# Patient Record
Sex: Male | Born: 1935 | Race: White | Hispanic: No | Marital: Married | Smoking: Former smoker
Health system: Southern US, Community
[De-identification: ages and names within clinical notes are randomized; demographics above are authoritative.]

## PROBLEM LIST (undated history)

## (undated) DIAGNOSIS — M10071 Idiopathic gout, right ankle and foot: Secondary | ICD-10-CM

## (undated) DIAGNOSIS — I7300 Raynaud's syndrome without gangrene: Secondary | ICD-10-CM

## (undated) DIAGNOSIS — C4490 Unspecified malignant neoplasm of skin, unspecified: Secondary | ICD-10-CM

## (undated) DIAGNOSIS — K5900 Constipation, unspecified: Secondary | ICD-10-CM

## (undated) DIAGNOSIS — K635 Polyp of colon: Secondary | ICD-10-CM

## (undated) DIAGNOSIS — R739 Hyperglycemia, unspecified: Secondary | ICD-10-CM

## (undated) DIAGNOSIS — R1907 Generalized intra-abdominal and pelvic swelling, mass and lump: Secondary | ICD-10-CM

## (undated) DIAGNOSIS — I4892 Unspecified atrial flutter: Secondary | ICD-10-CM

## (undated) DIAGNOSIS — N529 Male erectile dysfunction, unspecified: Secondary | ICD-10-CM

## (undated) DIAGNOSIS — M1990 Unspecified osteoarthritis, unspecified site: Secondary | ICD-10-CM

## (undated) HISTORY — PX: CATARACT EXTRACTION: SUR2

## (undated) HISTORY — PX: OTHER SURGICAL HISTORY: SHX169

## (undated) HISTORY — PX: RETINAL DETACHMENT SURGERY: SHX105

## (undated) HISTORY — PX: APPENDECTOMY: SHX54

## (undated) HISTORY — PX: TONSILLECTOMY: SUR1361

---

## 2014-06-30 DIAGNOSIS — E7800 Pure hypercholesterolemia, unspecified: Secondary | ICD-10-CM | POA: Insufficient documentation

## 2014-10-07 ENCOUNTER — Ambulatory Visit: Payer: Self-pay | Admitting: Internal Medicine

## 2015-03-11 ENCOUNTER — Other Ambulatory Visit: Payer: Self-pay | Admitting: Urology

## 2015-03-11 DIAGNOSIS — N138 Other obstructive and reflux uropathy: Secondary | ICD-10-CM

## 2015-03-11 DIAGNOSIS — N401 Enlarged prostate with lower urinary tract symptoms: Principal | ICD-10-CM

## 2015-03-11 MED ORDER — TAMSULOSIN HCL 0.4 MG PO CAPS: 0.4000 mg | ORAL_CAPSULE | Freq: Once | ORAL | Status: DC

## 2015-05-07 ENCOUNTER — Other Ambulatory Visit: Payer: Self-pay | Admitting: Urology

## 2015-05-12 DIAGNOSIS — R1907 Generalized intra-abdominal and pelvic swelling, mass and lump: Secondary | ICD-10-CM | POA: Insufficient documentation

## 2015-05-12 DIAGNOSIS — Z86010 Personal history of colonic polyps: Secondary | ICD-10-CM | POA: Insufficient documentation

## 2015-05-21 ENCOUNTER — Encounter: Payer: Self-pay | Admitting: *Deleted

## 2015-05-22 ENCOUNTER — Encounter
Admission: RE | Disposition: A | Discharge status: Home / Self Care | Payer: Self-pay | Source: Ambulatory Visit | Attending: Unknown Physician Specialty

## 2015-05-22 ENCOUNTER — Ambulatory Visit
Admission: RE | Admit: 2015-05-22 | Discharge: 2015-05-22 | Disposition: A | Discharge status: Home / Self Care | Payer: Medicare Other | Source: Ambulatory Visit | Attending: Unknown Physician Specialty | Admitting: Unknown Physician Specialty

## 2015-05-22 ENCOUNTER — Ambulatory Visit: Payer: Medicare Other | Admitting: Anesthesiology

## 2015-05-22 DIAGNOSIS — M10071 Idiopathic gout, right ankle and foot: Secondary | ICD-10-CM | POA: Insufficient documentation

## 2015-05-22 DIAGNOSIS — K640 First degree hemorrhoids: Secondary | ICD-10-CM | POA: Insufficient documentation

## 2015-05-22 DIAGNOSIS — I10 Essential (primary) hypertension: Secondary | ICD-10-CM | POA: Insufficient documentation

## 2015-05-22 DIAGNOSIS — E039 Hypothyroidism, unspecified: Secondary | ICD-10-CM | POA: Diagnosis not present

## 2015-05-22 DIAGNOSIS — Z86010 Personal history of colonic polyps: Secondary | ICD-10-CM | POA: Insufficient documentation

## 2015-05-22 DIAGNOSIS — K5730 Diverticulosis of large intestine without perforation or abscess without bleeding: Secondary | ICD-10-CM | POA: Insufficient documentation

## 2015-05-22 DIAGNOSIS — M1990 Unspecified osteoarthritis, unspecified site: Secondary | ICD-10-CM | POA: Insufficient documentation

## 2015-05-22 DIAGNOSIS — Z79899 Other long term (current) drug therapy: Secondary | ICD-10-CM | POA: Insufficient documentation

## 2015-05-22 DIAGNOSIS — D123 Benign neoplasm of transverse colon: Secondary | ICD-10-CM | POA: Insufficient documentation

## 2015-05-22 DIAGNOSIS — D127 Benign neoplasm of rectosigmoid junction: Secondary | ICD-10-CM | POA: Diagnosis not present

## 2015-05-22 DIAGNOSIS — Z885 Allergy status to narcotic agent status: Secondary | ICD-10-CM | POA: Insufficient documentation

## 2015-05-22 DIAGNOSIS — I4892 Unspecified atrial flutter: Secondary | ICD-10-CM | POA: Diagnosis not present

## 2015-05-22 DIAGNOSIS — N529 Male erectile dysfunction, unspecified: Secondary | ICD-10-CM | POA: Diagnosis not present

## 2015-05-22 DIAGNOSIS — Z886 Allergy status to analgesic agent status: Secondary | ICD-10-CM | POA: Insufficient documentation

## 2015-05-22 DIAGNOSIS — Z09 Encounter for follow-up examination after completed treatment for conditions other than malignant neoplasm: Secondary | ICD-10-CM | POA: Insufficient documentation

## 2015-05-22 DIAGNOSIS — Z85828 Personal history of other malignant neoplasm of skin: Secondary | ICD-10-CM | POA: Diagnosis not present

## 2015-05-22 DIAGNOSIS — I7300 Raynaud's syndrome without gangrene: Secondary | ICD-10-CM | POA: Insufficient documentation

## 2015-05-22 DIAGNOSIS — K635 Polyp of colon: Secondary | ICD-10-CM | POA: Insufficient documentation

## 2015-05-22 HISTORY — DX: Idiopathic gout, right ankle and foot: M10.071

## 2015-05-22 HISTORY — DX: Generalized intra-abdominal and pelvic swelling, mass and lump: R19.07

## 2015-05-22 HISTORY — DX: Unspecified atrial flutter: I48.92

## 2015-05-22 HISTORY — DX: Unspecified malignant neoplasm of skin, unspecified: C44.90

## 2015-05-22 HISTORY — DX: Hyperglycemia, unspecified: R73.9

## 2015-05-22 HISTORY — DX: Polyp of colon: K63.5

## 2015-05-22 HISTORY — PX: COLONOSCOPY WITH PROPOFOL: SHX5780

## 2015-05-22 HISTORY — DX: Male erectile dysfunction, unspecified: N52.9

## 2015-05-22 HISTORY — DX: Constipation, unspecified: K59.00

## 2015-05-22 HISTORY — DX: Unspecified osteoarthritis, unspecified site: M19.90

## 2015-05-22 HISTORY — DX: Raynaud's syndrome without gangrene: I73.00

## 2015-05-22 SURGERY — COLONOSCOPY WITH PROPOFOL
Anesthesia: General

## 2015-05-22 MED ORDER — SODIUM CHLORIDE 0.9 % IV SOLN: INTRAVENOUS | Status: DC

## 2015-05-22 MED ORDER — SOTALOL HCL 120 MG PO TABS: 120.0000 mg | ORAL_TABLET | Freq: Once | ORAL | Status: DC

## 2015-05-22 MED ORDER — LIDOCAINE HCL (PF) 1 % IJ SOLN
INTRAMUSCULAR | Status: AC
  Administered 2015-05-22: 0.3 mL
  Filled 2015-05-22: qty 2

## 2015-05-22 MED ORDER — LIDOCAINE HCL (PF) 1 % IJ SOLN: 2.0000 mL | Freq: Once | INTRAMUSCULAR | Status: DC

## 2015-05-22 MED ORDER — SOTALOL HCL 80 MG PO TABS
120.0000 mg | ORAL_TABLET | ORAL | Status: DC
  Filled 2015-05-22: qty 1.5

## 2015-05-22 MED ORDER — SOTALOL HCL 120 MG PO TABS
120.0000 mg | ORAL_TABLET | ORAL | Status: DC
  Administered 2015-05-22: 120 mg via ORAL
  Filled 2015-05-22: qty 1

## 2015-05-22 MED ORDER — PROPOFOL 10 MG/ML IV BOLUS
INTRAVENOUS | Status: DC | PRN
  Administered 2015-05-22: 80 mg via INTRAVENOUS

## 2015-05-22 MED ORDER — SODIUM CHLORIDE 0.9 % IV SOLN
INTRAVENOUS | Status: DC
  Administered 2015-05-22: 1000 mL via INTRAVENOUS

## 2015-05-22 MED ORDER — LACTATED RINGERS IV SOLN
INTRAVENOUS | Status: DC | PRN
  Administered 2015-05-22: 13:00:00 via INTRAVENOUS

## 2015-05-22 MED ORDER — PROPOFOL INFUSION 10 MG/ML OPTIME
INTRAVENOUS | Status: DC | PRN
  Administered 2015-05-22: 125 ug/kg/min via INTRAVENOUS

## 2015-05-22 NOTE — Op Note (Signed)
[facility] Patient Name: Kerry Rowland Procedure Date: 05/22/2015 1:11 PM MRN: [id number] Account #: [id number] Date of Birth: [birth year] Admit Type: Outpatient Age: 79 Room: [facility] ENDO ROOM 1 Gender: Male Note Status: Finalized Procedure:         Colonoscopy Indications:       Personal history of colonic polyps Providers:         Manya Silvas, MD Referring MD:      Ramonita Lab, MD (Referring MD) Medicines:         Propofol per Anesthesia Complications:     No immediate complications. Procedure:         Pre-Anesthesia Assessment:                    - After reviewing the risks and benefits, the patient was                     deemed in satisfactory condition to undergo the procedure.                    After obtaining informed consent, the colonoscope was                     passed under direct vision. Throughout the procedure, the                     patient's blood pressure, pulse, and oxygen saturations                     were monitored continuously. The Olympus PCF-H180AL                     colonoscope ( S#: [id number] ) was introduced through the                     anus and advanced to the the cecum, identified by                     appendiceal orifice and ileocecal valve. The colonoscopy                     was performed without difficulty. The patient tolerated                     the procedure well. The quality of the bowel preparation                     was excellent. Findings:      Three sessile polyps were found in the recto-sigmoid colon and in the       transverse colon. The polyps were diminutive in size. These polyps were       removed with a jumbo cold forceps. Resection and retrieval were complete.      A few small-mouthed diverticula were found in the sigmoid colon.      Internal hemorrhoids were found during endoscopy. The hemorrhoids were       small and Grade I (internal hemorrhoids that do not prolapse). Impression:         - Three diminutive polyps at the recto-sigmoid colon and                     in the transverse colon. Resected and retrieved.                    -  Diverticulosis in the sigmoid colon.                    - Internal hemorrhoids. Recommendation:    - Await pathology results. Manya Silvas, MD 05/22/2015 1:56:38 PM This report has been signed electronically. Number of Addenda: 0 Note Initiated On: 05/22/2015 1:11 PM Scope Withdrawal Time: 0 hours 16 minutes 49 seconds  Total Procedure Duration: 0 hours 26 minutes 4 seconds       [facility]

## 2015-05-22 NOTE — H&P (Signed)
   Primary Care Physician:  Adin Hector, MD Primary Gastroenterologist:  Dr. Vira Agar  Pre-Procedure History & Physical: HPI:  Kerry Rowland is a 79 y.o. male is here for an colonoscopy.   Past Medical History  Diagnosis Date  . Constipation   . Colon polyps   . Generalized abdominal mass   . Acute idiopathic gout of right foot   . Paroxysmal atrial flutter   . Erectile dysfunction   . Osteoarthritis   . Skin cancer   . Raynaud's phenomenon   . Hyperglycemia     No past surgical history on file.  Prior to Admission medications   Medication Sig Start Date End Date Taking? Authorizing Provider  cetirizine (ZYRTEC) 10 MG chewable tablet Chew 10 mg by mouth daily.   Yes Historical Provider, MD  cyclobenzaprine (FLEXERIL) 10 MG tablet Take 10 mg by mouth 3 (three) times daily as needed for muscle spasms.   Yes Historical Provider, MD  finasteride (PROSCAR) 5 MG tablet TAKE 1 TABLET DAILY 05/07/15   Hollice Espy, MD  iodine-sodium iodide 2-2.4 % solution Apply topically once.   Yes Historical Provider, MD  sotalol (BETAPACE) 120 MG tablet Take 120 mg by mouth 2 (two) times daily.   Yes Historical Provider, MD  tamsulosin (FLOMAX) 0.4 MG CAPS capsule Take 1 capsule (0.4 mg total) by mouth once. 03/11/15   Nori Riis, PA-C  tamsulosin (FLOMAX) 0.4 MG CAPS capsule Take 0.4 mg by mouth.   Yes Historical Provider, MD    Allergies as of 05/12/2015  . (Not on File)    No family history on file.  Social History   Social History  . Marital Status: Married    Spouse Name: N/A  . Number of Children: N/A  . Years of Education: N/A   Occupational History  . Not on file.   Social History Main Topics  . Smoking status: Not on file  . Smokeless tobacco: Not on file  . Alcohol Use: Not on file  . Drug Use: Not on file  . Sexual Activity: Not on file   Other Topics Concern  . Not on file   Social History Narrative  . No narrative on file    Review of Systems: See  HPI, otherwise negative ROS  Physical Exam: BP 157/62 mmHg  Pulse 63  Temp(Src) 96.1 F (35.6 C) (Tympanic)  Resp 17  Ht 6\' 2"  (1.88 m)  Wt 77.111 kg (170 lb)  BMI 21.82 kg/m2  SpO2 100% General:   Alert,  pleasant and cooperative in NAD Head:  Normocephalic and atraumatic. Neck:  Supple; no masses or thyromegaly. Lungs:  Clear throughout to auscultation.    Heart:  Regular rate and rhythm. Abdomen:  Soft, nontender and nondistended. Normal bowel sounds, without guarding, and without rebound.   Neurologic:  Alert and  oriented x4;  grossly normal neurologically.  Impression/Plan: SABASTIEN Rowland is here for an colonoscopy to be performed for [facility] colon polyps  Risks, benefits, limitations, and alternatives regarding  colonoscopy have been reviewed with the patient.  Questions have been answered.  All parties agreeable.   Gaylyn Cheers, MD  05/22/2015, 1:04 PM

## 2015-05-22 NOTE — Anesthesia Postprocedure Evaluation (Signed)
  Anesthesia Post-op Note  Patient: Kerry Rowland  Procedure(s) Performed: Procedure(s): COLONOSCOPY WITH PROPOFOL (N/A)  Anesthesia type:General  Patient location: PACU  Post pain: Pain level controlled  Post assessment: Post-op Vital signs reviewed, Patient's Cardiovascular Status Stable, Respiratory Function Stable, Patent Airway and No signs of Nausea or vomiting  Post vital signs: Reviewed and stable  Last Vitals:  Filed Vitals:   05/22/15 1410  BP: 99/60  Pulse: 57  Temp:   Resp: 11    Level of consciousness: awake, alert  and patient cooperative  Complications: No apparent anesthesia complications

## 2015-05-22 NOTE — Anesthesia Preprocedure Evaluation (Signed)
Anesthesia Evaluation  Patient identified by MRN, date of birth, ID band Patient awake    Reviewed: Allergy & Precautions, NPO status , Patient's Chart, lab work & pertinent test results  Airway Mallampati: II  TM Distance: >3 FB Neck ROM: Full    Dental  (+) Implants   Pulmonary          Cardiovascular hypertension, Pt. on medications and Pt. on home beta blockers     Neuro/Psych    GI/Hepatic   Endo/Other  Hypothyroidism (hx, no meds so far)   Renal/GU      Musculoskeletal  (+) Arthritis -,   Abdominal   Peds  Hematology   Anesthesia Other Findings   Reproductive/Obstetrics                             Anesthesia Physical Anesthesia Plan  ASA: II  Anesthesia Plan: General   Post-op Pain Management:    Induction: Intravenous  Airway Management Planned: Nasal Cannula  Additional Equipment:   Intra-op Plan:   Post-operative Plan:   Informed Consent: I have reviewed the patients History and Physical, chart, labs and discussed the procedure including the risks, benefits and alternatives for the proposed anesthesia with the patient or authorized representative who has indicated his/her understanding and acceptance.     Plan Discussed with:   Anesthesia Plan Comments:         Anesthesia Quick Evaluation

## 2015-05-22 NOTE — Transfer of Care (Signed)
Immediate Anesthesia Transfer of Care Note  Patient: Kerry Rowland  Procedure(s) Performed: Procedure(s): COLONOSCOPY WITH PROPOFOL (N/A)  Patient Location: PACU and Endoscopy Unit  Anesthesia Type:General  Level of Consciousness: awake, alert  and oriented  Airway & Oxygen Therapy: Patient Spontanous Breathing and Patient connected to nasal cannula oxygen  Post-op Assessment: Report given to RN and Post -op Vital signs reviewed and stable  Post vital signs: Reviewed and stable  Last Vitals:  Filed Vitals:   05/22/15 1256  BP: 157/62  Pulse: 63  Temp: 35.6 C  Resp: 17    Complications: No apparent anesthesia complications

## 2015-05-26 ENCOUNTER — Encounter: Payer: Self-pay | Admitting: Unknown Physician Specialty

## 2015-05-26 LAB — SURGICAL PATHOLOGY

## 2016-01-29 ENCOUNTER — Other Ambulatory Visit: Payer: Self-pay | Admitting: Urology

## 2016-04-21 ENCOUNTER — Encounter: Payer: Self-pay | Admitting: Urology

## 2016-04-21 ENCOUNTER — Ambulatory Visit (INDEPENDENT_AMBULATORY_CARE_PROVIDER_SITE_OTHER): Payer: Medicare Other | Admitting: Urology

## 2016-04-21 VITALS — BP 125/74 | HR 48 | Ht 74.0 in | Wt 179.8 lb

## 2016-04-21 DIAGNOSIS — N401 Enlarged prostate with lower urinary tract symptoms: Secondary | ICD-10-CM | POA: Diagnosis not present

## 2016-04-21 DIAGNOSIS — R739 Hyperglycemia, unspecified: Secondary | ICD-10-CM | POA: Insufficient documentation

## 2016-04-21 DIAGNOSIS — C4490 Unspecified malignant neoplasm of skin, unspecified: Secondary | ICD-10-CM | POA: Insufficient documentation

## 2016-04-21 DIAGNOSIS — I4892 Unspecified atrial flutter: Secondary | ICD-10-CM | POA: Insufficient documentation

## 2016-04-21 DIAGNOSIS — E291 Testicular hypofunction: Secondary | ICD-10-CM

## 2016-04-21 DIAGNOSIS — N138 Other obstructive and reflux uropathy: Secondary | ICD-10-CM

## 2016-04-21 DIAGNOSIS — N528 Other male erectile dysfunction: Secondary | ICD-10-CM

## 2016-04-21 DIAGNOSIS — M1990 Unspecified osteoarthritis, unspecified site: Secondary | ICD-10-CM | POA: Insufficient documentation

## 2016-04-21 DIAGNOSIS — I7300 Raynaud's syndrome without gangrene: Secondary | ICD-10-CM | POA: Insufficient documentation

## 2016-04-21 DIAGNOSIS — N529 Male erectile dysfunction, unspecified: Secondary | ICD-10-CM | POA: Insufficient documentation

## 2016-04-21 MED ORDER — FINASTERIDE 5 MG PO TABS: 5.0000 mg | ORAL_TABLET | Freq: Every day | ORAL | 4 refills | Status: DC

## 2016-04-21 MED ORDER — TAMSULOSIN HCL 0.4 MG PO CAPS: ORAL_CAPSULE | ORAL | 4 refills | Status: DC

## 2016-04-21 NOTE — Progress Notes (Signed)
04/21/2016 2:29 PM   [name] 09/11/1936 [id number]  Referring provider: Adin Hector, MD [place], [place] [address]  Chief Complaint  Patient presents with  . Follow-up    HPI: Patient is a 80 year old Caucasian male with erectile dysfunction, hypogonadism and BPH with LUTS who presents today for a yearly follow up.  Erectile dysfunction His SHIM score is 5, which is severe ED.   He has been having difficulty with erections for several years.   His major complaint is lack of erections.  His libido is dimished.   His risk factors for ED are age, BPH and HKD.  He denies any painful erections or curvatures with his erections.   He has tried PDE5-inhibitors in the past without success.        SHIM    Row Name 04/21/16 1415         SHIM: Over the last 6 months:   How do you rate your confidence that you could get and keep an erection? Very Low     When you had erections with sexual stimulation, how often were your erections hard enough for penetration (entering your partner)? Almost Never or Never     During sexual intercourse, how often were you able to maintain your erection after you had penetrated (entered) your partner? Extremely Difficult     During sexual intercourse, how difficult was it to maintain your erection to completion of intercourse? Extremely Difficult     When you attempted sexual intercourse, how often was it satisfactory for you? Extremely Difficult       SHIM Total Score   SHIM 5        Score: 1-7 Severe ED 8-11 Moderate ED 12-16 Mild-Moderate ED 17-21 Mild ED 22-25 No ED   Hypogonadism Patient is experiencing a decrease in libido, a decrease in strength, a loss in height,  erections being less strong and a recent deterioration in an ability to play sports.  This is indicated by his responses to the ADAM questionnaire.  He was managing his hypogonadism with AVEED.      Androgen Deficiency in  the Aging Male    [place] Name 04/21/16 1400         Androgen Deficiency in the Aging Male   Do you have a decrease in libido (sex drive) Yes     Do you have lack of energy No     Do you have a decrease in strength and/or endurance Yes     Have you lost height Yes     Have you noticed a decreased "enjoyment of life" Yes     Are you sad and/or grumpy Yes     Are your erections less strong Yes     Have you noticed a recent deterioration in your ability to play sports Yes     Are you falling asleep after dinner No     Has there been a recent deterioration in your work performance No        BPH WITH LUTS His IPSS score today is 9, which is moderate lower urinary tract symptomatology. He is mostly satisfied with his quality life due to his urinary symptoms.  He denies any dysuria, hematuria or suprapubic pain.  He currently taking tamsulosin 0.4 mg daily and finasteride 5 mg daily.    He also denies any recent fevers, chills, nausea or vomiting.  He does not have a family history  of PCa.      IPSS    Row Name 04/21/16 1400         International Prostate Symptom Score   How often have you had the sensation of not emptying your bladder? Less than 1 in 5     How often have you had to urinate less than every two hours? Less than 1 in 5 times     How often have you found you stopped and started again several times when you urinated? Less than half the time     How often have you found it difficult to postpone urination? Less than 1 in 5 times     How often have you had a weak urinary stream? More than half the time     How often have you had to strain to start urination? Not at All     How many times did you typically get up at night to urinate? None     Total IPSS Score 9       Quality of Life due to urinary symptoms   If you were to spend the rest of your life with your urinary condition just the way it is now how would you feel about that? Mostly Satisfied        Score:  1-7  Mild 8-19 Moderate 20-35 Severe    PMH: Past Medical History:  Diagnosis Date  . Acute idiopathic gout of right foot   . Colon polyps   . Constipation   . Erectile dysfunction   . Generalized abdominal mass   . Hyperglycemia   . Osteoarthritis   . Paroxysmal atrial flutter ([place])   . Raynaud's phenomenon   . Skin cancer     Surgical History: Past Surgical History:  Procedure Laterality Date  . COLONOSCOPY WITH PROPOFOL N/A 05/22/2015   Procedure: COLONOSCOPY WITH PROPOFOL;  Surgeon: Manya Silvas, MD;  Location: [facility] ENDOSCOPY;  Service: Endoscopy;  Laterality: N/A;    Home Medications:    Medication List       Accurate as of 04/21/16  2:29 PM. Always use your most recent med list.          cetirizine 10 MG chewable tablet Commonly known as:  ZYRTEC Chew 10 mg by mouth daily.   cyclobenzaprine 10 MG tablet Commonly known as:  FLEXERIL Take 10 mg by mouth 3 (three) times daily as needed for muscle spasms.   finasteride 5 MG tablet Commonly known as:  PROSCAR Take 1 tablet (5 mg total) by mouth daily.   iodine-sodium iodide 2-2.4 % solution Apply topically once.   sotalol 120 MG tablet Commonly known as:  BETAPACE Take 120 mg by mouth 2 (two) times daily.   tamsulosin 0.4 MG Caps capsule Commonly known as:  FLOMAX TAKE 1 CAPSULE ONCE AS DIRECTED       Allergies:  Allergies  Allergen Reactions  . Morphine And Related   . Nsaids   . Phenergan [Promethazine Hcl]     Family History: History reviewed. No pertinent family history.  Social History:  reports that he has quit smoking. His smoking use included Cigarettes. He has never used smokeless tobacco. His alcohol and drug histories are not on file.  ROS: UROLOGY Frequent Urination?: No Hard to postpone urination?: No Burning/pain with urination?: No Get up at night to urinate?: No Leakage of urine?: No Urine stream starts and stops?: No Trouble starting stream?: No Do you have to strain  to urinate?: No Blood in  urine?: No Urinary tract infection?: No Sexually transmitted disease?: No Injury to kidneys or bladder?: No Painful intercourse?: No Weak stream?: No Erection problems?: Yes Penile pain?: No  Gastrointestinal Nausea?: No Vomiting?: No Indigestion/heartburn?: No Diarrhea?: No Constipation?: No  Constitutional Fever: No Night sweats?: No Weight loss?: No Fatigue?: No  Skin Skin rash/lesions?: No Itching?: No  Eyes Blurred vision?: No Double vision?: No  Ears/Nose/Throat Sore throat?: No Sinus problems?: No  Hematologic/Lymphatic Swollen glands?: No Easy bruising?: No  Cardiovascular Leg swelling?: No Chest pain?: No  Respiratory Cough?: No Shortness of breath?: No  Endocrine Excessive thirst?: No  Musculoskeletal Back pain?: No Joint pain?: No  Neurological Headaches?: No     Physical Exam: BP 125/74 (BP Location: Left Arm, Patient Position: Sitting, Cuff Size: Normal)   Pulse (!) 48   Ht 6\' 2"  (1.88 m)   Wt 179 lb 12.8 oz (81.6 kg)   BMI 23.08 kg/m   Constitutional: Well nourished. Alert and oriented, No acute distress. HEENT: [place] AT, moist mucus membranes. Trachea midline, no masses. Cardiovascular: No clubbing, cyanosis, or edema. Respiratory: Normal respiratory effort, no increased work of breathing. GI: Abdomen is soft, non tender, non distended, no abdominal masses. Liver and spleen not palpable.  No hernias appreciated.  Stool sample for occult testing is not indicated.   GU: No CVA tenderness.  No bladder fullness or masses.  Patient with circumcised phallus.  Urethral meatus is patent.  No penile discharge. No penile lesions or rashes. Scrotum without lesions, cysts, rashes and/or edema.  Testicles are located scrotally bilaterally. They are atrophic.  No masses are appreciated in the testicles. Left and right epididymis are normal. Rectal: Patient with  normal sphincter tone. Anus and perineum without scarring  or rashes. No rectal masses are appreciated. Prostate is approximately 50 grams, no nodules are appreciated. Seminal vesicles are normal. Skin: No rashes, bruises or suspicious lesions. Lymph: No cervical or inguinal adenopathy. Neurologic: Grossly intact, no focal deficits, moving all 4 extremities. Psychiatric: Normal mood and affect.   Assessment & Plan:    1. Hypogonadism:     -Patient did not find AVEED effective, he does not want to pursue other therapies at this time  2. BPH with LUTS  - IPSS score is 9/2  - Continue tamsulosin 0.4 mg daily and finasteride 5 mg daily  - RTC in 12 months for IPSS and exam  3. Erectile dysfunction:     -SHIM score is 5  -PDE5-inhibitors not effective  -Discussed Trimix, would like to think about, will call if interested  -RTC in 12 months for SHIM score and exam   Return in about 1 year (around 04/21/2017) for IPSS, SHIM and exam.  These notes generated with voice recognition software. I apologize for typographical errors.  Zara Council, [facility] [address] [place], [place] [address] [phone]

## 2016-04-22 ENCOUNTER — Telehealth: Payer: Self-pay | Admitting: Urology

## 2016-04-22 NOTE — Telephone Encounter (Signed)
We will contact [facility] and give them the script for Trimix.  They will then contact the patient.  Once he gets the medication, he will need to schedule a test dose in the office with one of the providers.   Please call a Trimix prescription to [facility].

## 2016-04-22 NOTE — Telephone Encounter (Signed)
Called and spoke with [facility] and gave them the script For Trimix and also called the patient to let him know that after they call him that it is ready then he can call office and schedule appointment to receive a test dose with one of the providers. Patient states understanding and is ok with plan.

## 2016-05-04 ENCOUNTER — Telehealth: Payer: Self-pay | Admitting: Urology

## 2016-05-04 ENCOUNTER — Encounter: Payer: Self-pay | Admitting: Urology

## 2016-05-04 ENCOUNTER — Ambulatory Visit (INDEPENDENT_AMBULATORY_CARE_PROVIDER_SITE_OTHER): Payer: Medicare Other | Admitting: Urology

## 2016-05-04 VITALS — BP 146/73 | HR 54 | Ht 74.0 in | Wt 178.3 lb

## 2016-05-04 DIAGNOSIS — N528 Other male erectile dysfunction: Secondary | ICD-10-CM | POA: Diagnosis not present

## 2016-05-04 DIAGNOSIS — N529 Male erectile dysfunction, unspecified: Secondary | ICD-10-CM

## 2016-05-04 NOTE — Telephone Encounter (Signed)
Please schedule a training appointment with this patient for Trimix.  This needs to be an AM appointment.

## 2016-05-04 NOTE — Progress Notes (Signed)
05/04/2016 4:25 PM   Kerry Rowland 05-Jan-1936 [id number]  Referring provider: Adin Hector, MD [place], [place] [address]  Chief Complaint  Patient presents with  . Erectile Dysfunction    Trimix injection teaching    HPI: Patient is a 80 year old Caucasian male with erectile dysfunction, hypogonadism and BPH with LUTS who presents today for a Trimix test dose.  Erectile dysfunction His SHIM score is 5, which is severe ED.   He has been having difficulty with erections for several years.   His major complaint is lack of erections.  His libido is diminished.   His risk factors for ED are age, BPH and HKD.  He denies any painful erections or curvatures with his erections.   He has tried PDE5-inhibitors in the past without success.        SHIM    Row Name 04/21/16 1415         SHIM: Over the last 6 months:   How do you rate your confidence that you could get and keep an erection? Very Low     When you had erections with sexual stimulation, how often were your erections hard enough for penetration (entering your partner)? Almost Never or Never     During sexual intercourse, how often were you able to maintain your erection after you had penetrated (entered) your partner? Extremely Difficult     During sexual intercourse, how difficult was it to maintain your erection to completion of intercourse? Extremely Difficult     When you attempted sexual intercourse, how often was it satisfactory for you? Extremely Difficult       SHIM Total Score   SHIM 5        Score: 1-7 Severe ED 8-11 Moderate ED 12-16 Mild-Moderate ED 17-21 Mild ED 22-25 No ED   Hypogonadism Patient is experiencing a decrease in libido, a decrease in strength, a loss in height,  erections being less strong and a recent deterioration in an ability to play sports.  This is indicated by his responses to the ADAM questionnaire.  He was managing his hypogonadism  with AVEED.      Androgen Deficiency in the Aging Male    [place] Name 04/21/16 1400         Androgen Deficiency in the Aging Male   Do you have a decrease in libido (sex drive) Yes     Do you have lack of energy No     Do you have a decrease in strength and/or endurance Yes     Have you lost height Yes     Have you noticed a decreased "enjoyment of life" Yes     Are you sad and/or grumpy Yes     Are your erections less strong Yes     Have you noticed a recent deterioration in your ability to play sports Yes     Are you falling asleep after dinner No     Has there been a recent deterioration in your work performance No        BPH WITH LUTS His IPSS score today is 9, which is moderate lower urinary tract symptomatology. He is mostly satisfied with his quality life due to his urinary symptoms.  He denies any dysuria, hematuria or suprapubic pain.  He currently taking tamsulosin 0.4 mg daily and finasteride 5 mg daily.    He also denies any recent fevers, chills, nausea or vomiting.  He does not have a family history of PCa.      IPSS    Row Name 04/21/16 1400         International Prostate Symptom Score   How often have you had the sensation of not emptying your bladder? Less than 1 in 5     How often have you had to urinate less than every two hours? Less than 1 in 5 times     How often have you found you stopped and started again several times when you urinated? Less than half the time     How often have you found it difficult to postpone urination? Less than 1 in 5 times     How often have you had a weak urinary stream? More than half the time     How often have you had to strain to start urination? Not at All     How many times did you typically get up at night to urinate? None     Total IPSS Score 9       Quality of Life due to urinary symptoms   If you were to spend the rest of your life with your urinary condition just the way it is now how would you feel about that?  Mostly Satisfied        Score:  1-7 Mild 8-19 Moderate 20-35 Severe    PMH: Past Medical History:  Diagnosis Date  . Acute idiopathic gout of right foot   . Colon polyps   . Constipation   . Erectile dysfunction   . Generalized abdominal mass   . Hyperglycemia   . Osteoarthritis   . Paroxysmal atrial flutter ([place])   . Raynaud's phenomenon   . Skin cancer     Surgical History: Past Surgical History:  Procedure Laterality Date  . COLONOSCOPY WITH PROPOFOL N/A 05/22/2015   Procedure: COLONOSCOPY WITH PROPOFOL;  Surgeon: Manya Silvas, MD;  Location: [facility] ENDOSCOPY;  Service: Endoscopy;  Laterality: N/A;    Home Medications:    Medication List       Accurate as of 05/04/16  4:25 PM. Always use your most recent med list.          cetirizine 10 MG chewable tablet Commonly known as:  ZYRTEC Chew 10 mg by mouth daily.   cyclobenzaprine 10 MG tablet Commonly known as:  FLEXERIL Take 10 mg by mouth 3 (three) times daily as needed for muscle spasms.   finasteride 5 MG tablet Commonly known as:  PROSCAR Take 1 tablet (5 mg total) by mouth daily.   iodine-sodium iodide 2-2.4 % solution Apply topically once.   PRESCRIPTION MEDICATION Trimix injections   sotalol 120 MG tablet Commonly known as:  BETAPACE Take 120 mg by mouth 2 (two) times daily.   tamsulosin 0.4 MG Caps capsule Commonly known as:  FLOMAX TAKE 1 CAPSULE ONCE AS DIRECTED       Allergies:  Allergies  Allergen Reactions  . Morphine And Related   . Nsaids   . Phenergan [Promethazine Hcl]     Family History: No family history on file.  Social History:  reports that he has quit smoking. His smoking use included Cigarettes. He has never used smokeless tobacco. His alcohol and drug histories are not on file.  ROS: UROLOGY Frequent Urination?: No Hard to postpone urination?: No Burning/pain with urination?: No Get up at night to urinate?: No Leakage of urine?: No Urine stream starts  and stops?: No Trouble starting  stream?: No Do you have to strain to urinate?: No Blood in urine?: No Urinary tract infection?: No Sexually transmitted disease?: No Injury to kidneys or bladder?: No Painful intercourse?: No Weak stream?: No Erection problems?: Yes Penile pain?: No  Gastrointestinal Nausea?: No Vomiting?: No Indigestion/heartburn?: No Diarrhea?: No Constipation?: No  Constitutional Fever: No Night sweats?: No Weight loss?: No Fatigue?: No  Skin Skin rash/lesions?: No Itching?: No  Eyes Blurred vision?: No Double vision?: No  Ears/Nose/Throat Sore throat?: No Sinus problems?: No  Hematologic/Lymphatic Swollen glands?: No Easy bruising?: No  Cardiovascular Leg swelling?: No Chest pain?: No  Respiratory Cough?: No Shortness of breath?: No  Endocrine Excessive thirst?: No  Musculoskeletal Back pain?: No Joint pain?: Yes  Neurological Headaches?: No Dizziness?: No  Psychologic Depression?: No Anxiety?: No  Physical Exam: BP (!) 146/73   Pulse (!) 54   Ht 6\' 2"  (1.88 m)   Wt 178 lb 4.8 oz (80.9 kg)   BMI 22.89 kg/m   Constitutional: Well nourished. Alert and oriented, No acute distress. HEENT: [place] AT, moist mucus membranes. Trachea midline, no masses. Cardiovascular: No clubbing, cyanosis, or edema. Respiratory: Normal respiratory effort, no increased work of breathing. GI: Abdomen is soft, non tender, non distended, no abdominal masses. Liver and spleen not palpable.  No hernias appreciated.  Stool sample for occult testing is not indicated.   GU: No CVA tenderness.  No bladder fullness or masses.  Patient with circumcised phallus.  Urethral meatus is patent.  No penile discharge. No penile lesions or rashes. Scrotum without lesions, cysts, rashes and/or edema.  Testicles are located scrotally bilaterally. They are atrophic.  No masses are appreciated in the testicles. Left and right epididymis are normal. Rectal: Deferred.     Skin: No rashes, bruises or suspicious lesions. Lymph: No cervical or inguinal adenopathy. Neurologic: Grossly intact, no focal deficits, moving all 4 extremities. Psychiatric: Normal mood and affect.  Procedure Patient's left corpus cavernosum is identified.  An area near the base of the penis is cleansed with rubbing alcohol.  Careful to avoid the dorsal vein, 5 mcg of Trimix is injected at a 90 degree angle into the left corpus cavernosum near the base of the penis.  Patient experienced a very firm erection in 15 minutes.     Assessment & Plan:    1. Hypogonadism:   (Not addressed at this visit)  -Patient did not find AVEED effective, he does not want to pursue other therapies at this time  2. BPH with LUTS   (Not addressed at this visit)  - IPSS score is 9/2  - Continue tamsulosin 0.4 mg daily and finasteride 5 mg daily  - RTC in 12 months for IPSS and exam  3. Erectile dysfunction:     -SHIM score is 5  -PDE5-inhibitors not effective  -Presented today for Trimix test dose, successful  -Patient to RTC for injection instruction  Return for RTC for Trimix teaching.  These notes generated with voice recognition software. I apologize for typographical errors.  Zara Council, [facility] [address] [place], [place] [address] [phone]

## 2016-05-05 NOTE — Telephone Encounter (Signed)
I have made his appt for 05-12-16 @ 9:15 am   Thanks,  Sharyn Lull

## 2016-05-12 ENCOUNTER — Ambulatory Visit: Payer: Self-pay | Admitting: Urology

## 2016-05-20 ENCOUNTER — Ambulatory Visit (INDEPENDENT_AMBULATORY_CARE_PROVIDER_SITE_OTHER): Payer: Medicare Other | Admitting: Urology

## 2016-05-20 ENCOUNTER — Encounter: Payer: Self-pay | Admitting: Urology

## 2016-05-20 VITALS — BP 132/62 | HR 58 | Ht 73.0 in | Wt 177.9 lb

## 2016-05-20 DIAGNOSIS — N528 Other male erectile dysfunction: Secondary | ICD-10-CM | POA: Diagnosis not present

## 2016-05-20 DIAGNOSIS — N529 Male erectile dysfunction, unspecified: Secondary | ICD-10-CM

## 2016-05-20 NOTE — Progress Notes (Signed)
05/20/2016 8:49 AM   Kerry Rowland [birth year] [id number]  Referring provider: Adin Hector, MD [place], [place] [address]  Chief Complaint  Patient presents with  . Erectile Dysfunction    trimix teaching    HPI: Patient is a 80 year old Caucasian male with erectile dysfunction, hypogonadism and BPH with LUTS who presents today for a Trimix injection teaching.    Erectile dysfunction His SHIM score is 5, which is severe ED.   He has been having difficulty with erections for several years.   His major complaint is lack of erections.  His libido is diminished.   His risk factors for ED are age, BPH and HKD.  He denies any painful erections or curvatures with his erections.   He has tried PDE5-inhibitors in the past without success.      SHIM    Row Name 04/21/16 1415         SHIM: Over the last 6 months:   How do you rate your confidence that you could get and keep an erection? Very Low     When you had erections with sexual stimulation, how often were your erections hard enough for penetration (entering your partner)? Almost Never or Never     During sexual intercourse, how often were you able to maintain your erection after you had penetrated (entered) your partner? Extremely Difficult     During sexual intercourse, how difficult was it to maintain your erection to completion of intercourse? Extremely Difficult     When you attempted sexual intercourse, how often was it satisfactory for you? Extremely Difficult       SHIM Total Score   SHIM 5        Score: 1-7 Severe ED 8-11 Moderate ED 12-16 Mild-Moderate ED 17-21 Mild ED 22-25 No ED   Hypogonadism Patient is experiencing a decrease in libido, a decrease in strength, a loss in height,  erections being less strong and a recent deterioration in an ability to play sports.  This is indicated by his responses to the ADAM questionnaire.  He was managing his hypogonadism with  AVEED.      Androgen Deficiency in the Aging Male    [place] Name 04/21/16 1400         Androgen Deficiency in the Aging Male   Do you have a decrease in libido (sex drive) Yes     Do you have lack of energy No     Do you have a decrease in strength and/or endurance Yes     Have you lost height Yes     Have you noticed a decreased "enjoyment of life" Yes     Are you sad and/or grumpy Yes     Are your erections less strong Yes     Have you noticed a recent deterioration in your ability to play sports Yes     Are you falling asleep after dinner No     Has there been a recent deterioration in your work performance No        BPH WITH LUTS His IPSS score today is 9, which is moderate lower urinary tract symptomatology. He is mostly satisfied with his quality life due to his urinary symptoms.  He denies any dysuria, hematuria or suprapubic pain.  He currently taking tamsulosin 0.4 mg daily and finasteride 5 mg daily.    He also denies any recent fevers, chills, nausea or vomiting.  He  does not have a family history of PCa.      IPSS    Row Name 04/21/16 1400         International Prostate Symptom Score   How often have you had the sensation of not emptying your bladder? Less than 1 in 5     How often have you had to urinate less than every two hours? Less than 1 in 5 times     How often have you found you stopped and started again several times when you urinated? Less than half the time     How often have you found it difficult to postpone urination? Less than 1 in 5 times     How often have you had a weak urinary stream? More than half the time     How often have you had to strain to start urination? Not at All     How many times did you typically get up at night to urinate? None     Total IPSS Score 9       Quality of Life due to urinary symptoms   If you were to spend the rest of your life with your urinary condition just the way it is now how would you feel about that? Mostly  Satisfied        Score:  1-7 Mild 8-19 Moderate 20-35 Severe    PMH: Past Medical History:  Diagnosis Date  . Acute idiopathic gout of right foot   . Colon polyps   . Constipation   . Erectile dysfunction   . Generalized abdominal mass   . Hyperglycemia   . Osteoarthritis   . Paroxysmal atrial flutter ([place])   . Raynaud's phenomenon   . Skin cancer     Surgical History: Past Surgical History:  Procedure Laterality Date  . APPENDECTOMY    . CATARACT EXTRACTION    . COLONOSCOPY WITH PROPOFOL N/A 05/22/2015   Procedure: COLONOSCOPY WITH PROPOFOL;  Surgeon: Manya Silvas, MD;  Location: [facility] ENDOSCOPY;  Service: Endoscopy;  Laterality: N/A;  . skin grafts    . TONSILLECTOMY      Home Medications:    Medication List       Accurate as of 05/20/16  8:49 AM. Always use your most recent med list.          cetirizine 10 MG chewable tablet Commonly known as:  ZYRTEC Chew 10 mg by mouth daily.   cyclobenzaprine 10 MG tablet Commonly known as:  FLEXERIL Take 10 mg by mouth 3 (three) times daily as needed for muscle spasms.   finasteride 5 MG tablet Commonly known as:  PROSCAR Take 1 tablet (5 mg total) by mouth daily.   iodine-sodium iodide 2-2.4 % solution Apply topically once.   PRESCRIPTION MEDICATION Trimix injections   sotalol 120 MG tablet Commonly known as:  BETAPACE Take 120 mg by mouth 2 (two) times daily.   tamsulosin 0.4 MG Caps capsule Commonly known as:  FLOMAX TAKE 1 CAPSULE ONCE AS DIRECTED       Allergies:  Allergies  Allergen Reactions  . Morphine And Related   . Nsaids   . Phenergan [Promethazine Hcl]     Family History: Family History  Problem Relation Age of Onset  . Kidney disease Neg Hx   . Prostate cancer Neg Hx     Social History:  reports that he has quit smoking. His smoking use included Cigarettes. He has never used smokeless tobacco. His alcohol and drug  histories are not on file.  ROS: UROLOGY Frequent  Urination?: No Hard to postpone urination?: No Burning/pain with urination?: No Get up at night to urinate?: No Leakage of urine?: No Urine stream starts and stops?: No Trouble starting stream?: No Do you have to strain to urinate?: No Blood in urine?: No Urinary tract infection?: No Sexually transmitted disease?: No Injury to kidneys or bladder?: No Painful intercourse?: No Weak stream?: No Erection problems?: Yes Penile pain?: No  Gastrointestinal Nausea?: No Vomiting?: No Indigestion/heartburn?: No Diarrhea?: No Constipation?: No  Constitutional Fever: No Night sweats?: No Weight loss?: No Fatigue?: No  Skin Skin rash/lesions?: No Itching?: No  Eyes Blurred vision?: No Double vision?: No  Ears/Nose/Throat Sore throat?: No Sinus problems?: No  Hematologic/Lymphatic Swollen glands?: No Easy bruising?: No  Cardiovascular Leg swelling?: No Chest pain?: No  Respiratory Cough?: No Shortness of breath?: No  Endocrine Excessive thirst?: No  Musculoskeletal Back pain?: No Joint pain?: No  Neurological Headaches?: No Dizziness?: No  Psychologic Depression?: No Anxiety?: No  Physical Exam: BP 132/62   Pulse (!) 58   Ht 6\' 1"  (1.854 m)   Wt 177 lb 14.4 oz (80.7 kg)   BMI 23.47 kg/m   Constitutional: Well nourished. Alert and oriented, No acute distress. HEENT: [place] AT, moist mucus membranes. Trachea midline, no masses. Cardiovascular: No clubbing, cyanosis, or edema. Respiratory: Normal respiratory effort, no increased work of breathing. GI: Abdomen is soft, non tender, non distended, no abdominal masses. Liver and spleen not palpable.  No hernias appreciated.  Stool sample for occult testing is not indicated.   GU: No CVA tenderness.  No bladder fullness or masses.  Patient with circumcised phallus.  Urethral meatus is patent.  No penile discharge. No penile lesions or rashes. Scrotum without lesions, cysts, rashes and/or edema.  Testicles are  located scrotally bilaterally. They are atrophic.  No masses are appreciated in the testicles. Left and right epididymis are normal. Rectal: Deferred.   Skin: No rashes, bruises or suspicious lesions. Lymph: No cervical or inguinal adenopathy. Neurologic: Grossly intact, no focal deficits, moving all 4 extremities. Psychiatric: Normal mood and affect.  Procedure Patient's right corpus cavernosum is identified.  An area near the base of the penis is cleansed with rubbing alcohol by the patient.  Careful to avoid the dorsal vein, 5 mcg of Trimix is injected at a 90 degree angle into the right corpus cavernosum near the base of the penis by the patient.  Patient demonstrated his proficiency.       Assessment & Plan:    1. Hypogonadism:   (Not addressed at this visit)  -Patient did not find AVEED effective, he does not want to pursue other therapies at this time  2. BPH with LUTS   (Not addressed at this visit)  - IPSS score is 9/2  - Continue tamsulosin 0.4 mg daily and finasteride 5 mg daily  - RTC in 12 months for IPSS and exam  3. Erectile dysfunction:     -SHIM score is 5  -PDE5-inhibitors not effective  -Presented today for Trimix injection instruction  - Demonstrated proficiency  - Patient going to [facility] and will need to find a sterile compounding pharmacy, he will call with information so that we can call in a script for him.  (Trimix, 1 cc syringes, # 5)  Return in about 1 year (around 05/20/2017) for IPSS, SHIM and exam.  These notes generated with voice recognition software. I apologize for typographical errors.  Annette Liotta  [place], [facility] [address] [place], [place] [address] [phone]

## 2016-06-16 ENCOUNTER — Telehealth: Payer: Self-pay | Admitting: Urology

## 2016-06-16 NOTE — Telephone Encounter (Signed)
Pt is in [place] for [place] and would like for you to call in a prescription to  [facility] [place].  Phone# [phone]  Fax# [phone].

## 2016-06-16 NOTE — Telephone Encounter (Signed)
Would you call the Trimix into his pharmacy?

## 2016-06-17 NOTE — Telephone Encounter (Signed)
[facility] and gave a verbal order of trimix 30/1/10.

## 2016-06-17 NOTE — Telephone Encounter (Signed)
Left a message for pharmacy to return the call.

## 2017-02-02 NOTE — Telephone Encounter (Signed)
error 

## 2017-04-19 NOTE — Progress Notes (Signed)
04/20/2017 4:24 PM   Kerry Rowland 04-23-1936 [id number]  Referring provider: Adin Hector, MD [facility] [place], [place] [address]  Chief Complaint  Patient presents with  . Erectile Dysfunction    1 year follow up    HPI: Patient is a 81 year old Caucasian male with erectile dysfunction, testosterone deficiency and BPH with LUTS who presents today for a one year follow up.    Erectile dysfunction His SHIM score is 5, which is severe ED.   His previous SHIM score was 5.  He has been having difficulty with erections for several years.   His major complaint is lack of erections.  His libido is diminished.   His risk factors for ED are age, BPH and HKD.  He denies any painful erections or curvatures with his erections.   He has tried PDE5-inhibitors in the past without success.  He was using Trimix at a time, but he found it ineffective.        SHIM    Row Name 04/20/17 1603         SHIM: Over the last 6 months:   How do you rate your confidence that you could get and keep an erection? Very Low     When you had erections with sexual stimulation, how often were your erections hard enough for penetration (entering your partner)? Almost Never or Never     During sexual intercourse, how often were you able to maintain your erection after you had penetrated (entered) your partner? Almost Never or Never     During sexual intercourse, how difficult was it to maintain your erection to completion of intercourse? Extremely Difficult     When you attempted sexual intercourse, how often was it satisfactory for you? Almost Never or Never       SHIM Total Score   SHIM 5        Score: 1-7 Severe ED 8-11 Moderate ED 12-16 Mild-Moderate ED 17-21 Mild ED 22-25 No ED  BPH WITH LUTS His IPSS score today is 5, which is mild lower urinary tract symptomatology. He is pleased with his quality life due to his urinary symptoms.  He denies any dysuria,  hematuria or suprapubic pain.  His previous I PSS score was 9/2.  He currently taking tamsulosin 0.4 mg daily and finasteride 5 mg daily.    He also denies any recent fevers, chills, nausea or vomiting.  He does not have a family history of PCa.      IPSS    Row Name 04/20/17 1600         International Prostate Symptom Score   How often have you had the sensation of not emptying your bladder? Less than 1 in 5     How often have you had to urinate less than every two hours? Less than 1 in 5 times     How often have you found you stopped and started again several times when you urinated? Not at All     How often have you found it difficult to postpone urination? Not at All     How often have you had a weak urinary stream? About half the time     How often have you had to strain to start urination? Not at All     How many times did you typically get up at night to urinate? None     Total IPSS Score 5  Quality of Life due to urinary symptoms   If you were to spend the rest of your life with your urinary condition just the way it is now how would you feel about that? Pleased        Score:  1-7 Mild 8-19 Moderate 20-35 Severe    PMH: Past Medical History:  Diagnosis Date  . Acute idiopathic gout of right foot   . Colon polyps   . Constipation   . Erectile dysfunction   . Generalized abdominal mass   . Hyperglycemia   . Osteoarthritis   . Paroxysmal atrial flutter ([place])   . Raynaud's phenomenon   . Skin cancer     Surgical History: Past Surgical History:  Procedure Laterality Date  . APPENDECTOMY    . CATARACT EXTRACTION    . COLONOSCOPY WITH PROPOFOL N/A 05/22/2015   Procedure: COLONOSCOPY WITH PROPOFOL;  Surgeon: Manya Silvas, MD;  Location: [facility] ENDOSCOPY;  Service: Endoscopy;  Laterality: N/A;  . skin grafts    . TONSILLECTOMY      Home Medications:  Allergies as of 04/20/2017      Reactions   Morphine And Related    Nsaids    Phenergan [promethazine  Hcl]       Medication List       Accurate as of 04/20/17  4:24 PM. Always use your most recent med list.          cetirizine 10 MG chewable tablet Commonly known as:  ZYRTEC Chew 10 mg by mouth daily.   cyclobenzaprine 10 MG tablet Commonly known as:  FLEXERIL Take 10 mg by mouth 3 (three) times daily as needed for muscle spasms.   finasteride 5 MG tablet Commonly known as:  PROSCAR Take 1 tablet (5 mg total) by mouth daily.   iodine-sodium iodide 2-2.4 % solution Apply topically once.   MULTI-VITAMINS Tabs Take by mouth.   PRESCRIPTION MEDICATION Trimix injections   sotalol 120 MG tablet Commonly known as:  BETAPACE Take 120 mg by mouth 2 (two) times daily.   tamsulosin 0.4 MG Caps capsule Commonly known as:  FLOMAX TAKE 1 CAPSULE ONCE AS DIRECTED       Allergies:  Allergies  Allergen Reactions  . Morphine And Related   . Nsaids   . Phenergan [Promethazine Hcl]     Family History: Family History  Problem Relation Age of Onset  . Kidney disease Neg Hx   . Prostate cancer Neg Hx   . Kidney cancer Neg Hx   . Bladder Cancer Neg Hx     Social History:  reports that he has quit smoking. His smoking use included Cigarettes. He has never used smokeless tobacco. He reports that he does not drink alcohol or use drugs.  ROS: UROLOGY Frequent Urination?: No Hard to postpone urination?: No Burning/pain with urination?: No Get up at night to urinate?: No Leakage of urine?: No Urine stream starts and stops?: No Trouble starting stream?: No Do you have to strain to urinate?: No Blood in urine?: No Urinary tract infection?: No Sexually transmitted disease?: No Injury to kidneys or bladder?: No Painful intercourse?: No Weak stream?: No Erection problems?: Yes Penile pain?: No  Gastrointestinal Nausea?: No Vomiting?: No Indigestion/heartburn?: No Diarrhea?: No Constipation?: No  Constitutional Fever: No Night sweats?: No Weight loss?:  No Fatigue?: No  Skin Skin rash/lesions?: No Itching?: No  Eyes Blurred vision?: No Double vision?: No  Ears/Nose/Throat Sore throat?: No Sinus problems?: No  Hematologic/Lymphatic Swollen glands?: No Easy  bruising?: No  Cardiovascular Leg swelling?: No Chest pain?: No  Respiratory Cough?: No Shortness of breath?: No  Endocrine Excessive thirst?: No  Musculoskeletal Back pain?: No Joint pain?: No  Neurological Headaches?: No Dizziness?: No  Psychologic Depression?: No Anxiety?: No  Physical Exam: BP 114/63   Pulse 66   Ht 6\' 1"  (1.854 m)   Wt 182 lb 12.8 oz (82.9 kg)   BMI 24.12 kg/m   Constitutional: Well nourished. Alert and oriented, No acute distress. HEENT: [place] AT, moist mucus membranes. Trachea midline, no masses. Cardiovascular: No clubbing, cyanosis, or edema. Respiratory: Normal respiratory effort, no increased work of breathing. GI: Abdomen is soft, non tender, non distended, no abdominal masses. Liver and spleen not palpable.  No hernias appreciated.  Stool sample for occult testing is not indicated.   GU: No CVA tenderness.  No bladder fullness or masses.  Patient with circumcised phallus.  Urethral meatus is patent.  No penile discharge. No penile lesions or rashes. Scrotum without lesions, cysts, rashes and/or edema.  Testicles are located scrotally bilaterally. They are atrophic.  No masses are appreciated in the testicles. Left and right epididymis are normal. Rectal: Patient with  normal sphincter tone. Anus and perineum without scarring or rashes. No rectal masses are appreciated. Prostate is approximately 55 grams, no nodules are appreciated. Seminal vesicles are normal. Skin: No rashes, bruises or suspicious lesions. Lymph: No cervical or inguinal adenopathy. Neurologic: Grossly intact, no focal deficits, moving all 4 extremities. Psychiatric: Normal mood and affect.  Assessment & Plan:    1. Testosterone deficiency    -Patient did  not find AVEED effective, he does not want to pursue other therapies at this time  2. BPH with LUTS     - IPSS score is 5/1, it is improving  - Continue tamsulosin 0.4 mg daily and finasteride 5 mg daily; refills given  - RTC in 12 months for IPSS and exam  3. Erectile dysfunction:     -SHIM score is 5  -PDE5-inhibitors not effective  -Trimix not effective - he will call with the dose - we may need to readjust   Return in about 1 year (around 04/20/2018) for IPSS, SHIM and exam.  These notes generated with voice recognition software. I apologize for typographical errors.  Zara Council, [facility] [address] [place], [place] [address] [phone]

## 2017-04-20 ENCOUNTER — Encounter: Payer: Self-pay | Admitting: Urology

## 2017-04-20 ENCOUNTER — Ambulatory Visit (INDEPENDENT_AMBULATORY_CARE_PROVIDER_SITE_OTHER): Payer: Medicare Other | Admitting: Urology

## 2017-04-20 VITALS — BP 114/63 | HR 66 | Ht 73.0 in | Wt 182.8 lb

## 2017-04-20 DIAGNOSIS — N529 Male erectile dysfunction, unspecified: Secondary | ICD-10-CM

## 2017-04-20 DIAGNOSIS — N401 Enlarged prostate with lower urinary tract symptoms: Secondary | ICD-10-CM | POA: Diagnosis not present

## 2017-04-20 DIAGNOSIS — N138 Other obstructive and reflux uropathy: Secondary | ICD-10-CM

## 2017-04-20 MED ORDER — TAMSULOSIN HCL 0.4 MG PO CAPS: ORAL_CAPSULE | ORAL | 4 refills | Status: DC

## 2017-04-20 MED ORDER — FINASTERIDE 5 MG PO TABS: 5.0000 mg | ORAL_TABLET | Freq: Every day | ORAL | 4 refills | Status: DC

## 2017-04-21 ENCOUNTER — Ambulatory Visit: Payer: Self-pay | Admitting: Urology

## 2017-05-22 ENCOUNTER — Ambulatory Visit: Payer: Self-pay | Admitting: Urology

## 2018-04-17 NOTE — Progress Notes (Signed)
04/18/2018 4:23 PM   Kerry Rowland [birth year] [id number]  Referring provider: Adin Hector, MD [facility] [place], [place] [address]  Chief Complaint  Patient presents with  . Erectile Dysfunction    HPI: Patient is a 82 year old Caucasian male with erectile dysfunction, testosterone deficiency and BPH with LUTS who presents today for a one year follow up.    Erectile dysfunction His SHIM score is 5, which is severe ED.   His previous SHIM score was 5.  He has been having difficulty with erections for several years.   His major complaint is lack of erections.  His libido is diminished.   His risk factors for ED are age, BPH and HKD.  He denies any painful erections or curvatures with his erections.   He has tried PDE5-inhibitors in the past without success.  He was using Trimix at a time, but he found it ineffective.  He is not interested in any therapies at this time.  BPH WITH LUTS His IPSS score today is 6, which is mild lower urinary tract symptomatology. He is pleased with his quality life due to his urinary symptoms.  He denies any dysuria, hematuria or suprapubic pain.  His previous I PSS score was 9/2.  He currently taking tamsulosin 0.4 mg daily and finasteride 5 mg daily.    He also denies any recent fevers, chills, nausea or vomiting.  He does not have a family history of PCa.  IPSS    Row Name 04/18/18 1600         International Prostate Symptom Score   How often have you had the sensation of not emptying your bladder?  Not at All     How often have you had to urinate less than every two hours?  Less than 1 in 5 times     How often have you found you stopped and started again several times when you urinated?  Less than 1 in 5 times     How often have you found it difficult to postpone urination?  Not at All     How often have you had a weak urinary stream?  More than half the time     How often have you had to strain to start  urination?  Not at All     How many times did you typically get up at night to urinate?  None     Total IPSS Score  6       Quality of Life due to urinary symptoms   If you were to spend the rest of your life with your urinary condition just the way it is now how would you feel about that?  Pleased        Score:  1-7 Mild 8-19 Moderate 20-35 Severe    PMH: Past Medical History:  Diagnosis Date  . Acute idiopathic gout of right foot   . Colon polyps   . Constipation   . Erectile dysfunction   . Generalized abdominal mass   . Hyperglycemia   . Osteoarthritis   . Paroxysmal atrial flutter ([place])   . Raynaud's phenomenon   . Skin cancer     Surgical History: Past Surgical History:  Procedure Laterality Date  . APPENDECTOMY    . CATARACT EXTRACTION    . COLONOSCOPY WITH PROPOFOL N/A 05/22/2015   Procedure: COLONOSCOPY WITH PROPOFOL;  Surgeon: Manya Silvas, MD;  Location: [facility] ENDOSCOPY;  Service: Endoscopy;  Laterality: N/A;  . skin grafts    . TONSILLECTOMY      Home Medications:  Allergies as of 04/18/2018      Reactions   Morphine And Related    Nsaids    Phenergan [promethazine Hcl]       Medication List        Accurate as of 04/18/18  4:23 PM. Always use your most recent med list.          cetirizine 10 MG chewable tablet Commonly known as:  ZYRTEC Chew 10 mg by mouth daily.   cyclobenzaprine 10 MG tablet Commonly known as:  FLEXERIL Take 10 mg by mouth 3 (three) times daily as needed for muscle spasms.   finasteride 5 MG tablet Commonly known as:  PROSCAR Take 1 tablet (5 mg total) by mouth daily.   iodine-sodium iodide 2-2.4 % solution Apply topically once.   MULTI-VITAMINS Tabs Take by mouth.   PRESCRIPTION MEDICATION Trimix injections   sotalol 120 MG tablet Commonly known as:  BETAPACE Take 120 mg by mouth 2 (two) times daily.   tamsulosin 0.4 MG Caps capsule Commonly known as:  FLOMAX TAKE 1 CAPSULE ONCE AS DIRECTED        Allergies:  Allergies  Allergen Reactions  . Morphine And Related   . Nsaids   . Phenergan [Promethazine Hcl]     Family History: Family History  Problem Relation Age of Onset  . Kidney disease Neg Hx   . Prostate cancer Neg Hx   . Kidney cancer Neg Hx   . Bladder Cancer Neg Hx     Social History:  reports that he has quit smoking. His smoking use included cigarettes. He has never used smokeless tobacco. He reports that he does not drink alcohol or use drugs.  ROS: UROLOGY Frequent Urination?: No Hard to postpone urination?: No Burning/pain with urination?: No Get up at night to urinate?: No Leakage of urine?: No Urine stream starts and stops?: No Trouble starting stream?: No Do you have to strain to urinate?: No Blood in urine?: No Urinary tract infection?: No Sexually transmitted disease?: No Injury to kidneys or bladder?: No Painful intercourse?: No Weak stream?: No Erection problems?: No Penile pain?: No  Gastrointestinal Nausea?: No Vomiting?: No Indigestion/heartburn?: No Diarrhea?: No Constipation?: No  Constitutional Fever: No Night sweats?: No Weight loss?: No Fatigue?: No  Skin Skin rash/lesions?: No Itching?: No  Eyes Blurred vision?: No Double vision?: No  Ears/Nose/Throat Sore throat?: No Sinus problems?: No  Hematologic/Lymphatic Swollen glands?: No Easy bruising?: No  Cardiovascular Leg swelling?: No Chest pain?: No  Respiratory Cough?: No Shortness of breath?: No  Endocrine Excessive thirst?: No  Musculoskeletal Back pain?: No Joint pain?: No  Neurological Headaches?: No Dizziness?: No  Psychologic Depression?: No Anxiety?: No  Physical Exam: BP 112/66   Pulse 65   Ht 6\' 2"  (1.88 m)   Wt 165 lb (74.8 kg)   BMI 21.18 kg/m   Constitutional: Well nourished. Alert and oriented, No acute distress. HEENT: [place] AT, moist mucus membranes. Trachea midline, no masses. Cardiovascular: No clubbing, cyanosis,  or edema. Respiratory: Normal respiratory effort, no increased work of breathing. GI: Abdomen is soft, non tender, non distended, no abdominal masses. Liver and spleen not palpable.  No hernias appreciated.  Stool sample for occult testing is not indicated.   GU: No CVA tenderness.  No bladder fullness or masses.  Patient with circumcised phallus.  Urethral meatus is patent.  No penile discharge. No penile  lesions or rashes. Scrotum without lesions, cysts, rashes and/or edema.  Testicles are located scrotally bilaterally. They are atrophic.  No masses are appreciated in the testicles. Left and right epididymis are normal. Rectal: Patient with  normal sphincter tone. Anus and perineum without scarring or rashes. No rectal masses are appreciated. Prostate is approximately 55 grams, no nodules are appreciated. Seminal vesicles are normal. Skin: No rashes, bruises or suspicious lesions. Lymph: No cervical or inguinal adenopathy. Neurologic: Grossly intact, no focal deficits, moving all 4 extremities. Psychiatric: Normal mood and affect.  Assessment & Plan:    1. Testosterone deficiency    -Patient did not find AVEED effective, he does not want to pursue other therapies at this time  2. BPH with LUTS     - IPSS score is 6/1, it is slightly improved   - Continue tamsulosin 0.4 mg daily and finasteride 5 mg daily; refills given  - RTC in 12 months for IPSS and exam  3. Erectile dysfunction:     -PDE5-inhibitors not effective  -Trimix not effective   - does not want to pursue treatment at this time   Return in about 1 year (around 04/19/2019) for IPSS, PSA and exam.  These notes generated with voice recognition software. I apologize for typographical errors.  Zara Council, PA-C  [facility] [address] [place], [place] [address] [phone]

## 2018-04-18 ENCOUNTER — Encounter: Payer: Self-pay | Admitting: Urology

## 2018-04-18 ENCOUNTER — Ambulatory Visit (INDEPENDENT_AMBULATORY_CARE_PROVIDER_SITE_OTHER): Payer: Medicare Other | Admitting: Urology

## 2018-04-18 VITALS — BP 112/66 | HR 65 | Ht 74.0 in | Wt 165.0 lb

## 2018-04-18 DIAGNOSIS — E349 Endocrine disorder, unspecified: Secondary | ICD-10-CM | POA: Diagnosis not present

## 2018-04-18 DIAGNOSIS — N529 Male erectile dysfunction, unspecified: Secondary | ICD-10-CM

## 2018-04-18 DIAGNOSIS — N138 Other obstructive and reflux uropathy: Secondary | ICD-10-CM

## 2018-04-18 DIAGNOSIS — N401 Enlarged prostate with lower urinary tract symptoms: Secondary | ICD-10-CM

## 2018-04-18 MED ORDER — FINASTERIDE 5 MG PO TABS: 5.0000 mg | ORAL_TABLET | Freq: Every day | ORAL | 4 refills | Status: DC

## 2018-04-18 MED ORDER — TAMSULOSIN HCL 0.4 MG PO CAPS: ORAL_CAPSULE | ORAL | 4 refills | Status: DC

## 2018-04-19 ENCOUNTER — Telehealth: Payer: Self-pay | Admitting: Family Medicine

## 2018-04-19 LAB — PSA: Prostate Specific Ag, Serum: 1.1 ng/mL (ref 0.0–4.0)

## 2018-04-19 NOTE — Telephone Encounter (Signed)
-----   Message from Nori Riis, PA-C sent at 04/19/2018  7:54 AM EDT ----- Please let Kerry Rowland know that his PSA was normal.  We will see him in one year.

## 2018-04-19 NOTE — Telephone Encounter (Signed)
Patient notified

## 2018-12-27 NOTE — Progress Notes (Signed)
Cardiology Office Note  Date:  12/28/2018   ID:  [name], DOB 1935/10/30, MRN [id number]  PCP:  Kerry Hector, MD   Chief Complaint  Patient presents with  . New Patient (Initial Visit)    Referred by Kerry Rowland for skip beats and flutter. Meds reviewed verbally with patient.   New Patient  HPI:  [name] is a 83 year old gentleman with past medical history of hyperglycemia,  hyperlipidemia,  history of atrial flutter,  osteoarthritis,  Gout Former smoker Referred by Dr. Ramonita Rowland for paroxysmal atrial flutter, bradycardia, palpitations  Reports long history of paroxysmal atrial flutter, Thinks he does appreciate flutter when he has it but not sure Does not remember the last time he had a very fast symptomatic rhythm  Recent problems with palpitations, dropped beats heart rate reading on his wrist watch has been below 40,  he does not have any symptoms with this.  -Heart rate in the mid 50s or lower  Periods of weakness,  Week ago, Resting pulse to 44,  38, "could be from skipping"  States he has been taking extra doses of sotalol, half pill sometimes in the middle of the day in addition to his 1 pill twice daily In effort to slow down his extra beats/ectopy  he spends part of the year in [place].  EKG with PMD reveals sinus rhythm with first-degree AV block, and probable LVH.  EKG on today's visit showing sinus bradycardia rate 55 bpm LVH, no significant ST or T wave changes  Rowland work reviewed total chol 208 Former smoker 20 years   PMH:   has a past medical history of Acute idiopathic gout of right foot, Colon polyps, Constipation, Erectile dysfunction, Generalized abdominal mass, Hyperglycemia, Osteoarthritis, Paroxysmal atrial flutter ([place]), Raynaud's phenomenon, and Skin cancer.  PSH:    Past Surgical History:  Procedure Laterality Date  . APPENDECTOMY    . CATARACT EXTRACTION    . COLONOSCOPY WITH PROPOFOL N/A 05/22/2015   Procedure:  COLONOSCOPY WITH PROPOFOL;  Surgeon: Manya Silvas, MD;  Location: [facility] ENDOSCOPY;  Service: Endoscopy;  Laterality: N/A;  . skin grafts    . TONSILLECTOMY      Current Outpatient Medications  Medication Sig Dispense Refill  . cetirizine (ZYRTEC) 10 MG chewable tablet Chew 10 mg by mouth daily.    . cyclobenzaprine (FLEXERIL) 10 MG tablet Take 10 mg by mouth 3 (three) times daily as needed for muscle spasms.    . finasteride (PROSCAR) 5 MG tablet Take 1 tablet (5 mg total) by mouth daily. 90 tablet 4  . Multiple Vitamin (MULTI-VITAMINS) TABS Take by mouth.    Marland Kitchen PRESCRIPTION MEDICATION Trimix injections    . sotalol (BETAPACE) 120 MG tablet Take 120 mg by mouth 2 (two) times daily.    . tamsulosin (FLOMAX) 0.4 MG CAPS capsule TAKE 1 CAPSULE ONCE AS DIRECTED 90 capsule 4  . propranolol (INDERAL) 20 MG tablet Take 1 tablet (20 mg total) by mouth 3 (three) times daily as needed. For extra beats. 90 tablet 2   No current facility-administered medications for this visit.      Allergies:   Morphine and related; Nsaids; and Phenergan [promethazine hcl]   Social History:  The patient  reports that he has quit smoking. His smoking use included cigarettes. He has never used smokeless tobacco. He reports that he does not drink alcohol or use drugs.   Family History:   family history is not on file.  Review of Systems: Review of Systems  Constitutional: Negative.   Respiratory: Negative.   Cardiovascular: Positive for palpitations.  Gastrointestinal: Negative.   Musculoskeletal: Negative.   Neurological: Negative.   Psychiatric/Behavioral: Negative.   All other systems reviewed and are negative.   PHYSICAL EXAM: VS:  BP (!) 155/85   Pulse (!) 55   Ht 6\' 1"  (1.854 m)   Wt 188 lb 4 oz (85.4 kg)   BMI 24.84 kg/m  , BMI Body mass index is 24.84 kg/m. GEN: Well nourished, well developed, in no acute distress  HEENT: normal  Neck: no JVD, carotid bruits, or masses Cardiac: RRR; no  murmurs, rubs, or gallops,no edema  Respiratory:  clear to auscultation bilaterally, normal work of breathing GI: soft, nontender, nondistended, + BS MS: no deformity or atrophy  Skin: warm and dry, no rash Neuro:  Strength and sensation are intact Psych: euthymic mood, full affect   Recent Labs: No results found for requested labs within last 8760 hours.    Lipid Panel No results found for: CHOL, HDL, LDLCALC, TRIG    Wt Readings from Last 3 Encounters:  12/28/18 188 lb 4 oz (85.4 kg)  04/18/18 165 lb (74.8 kg)  04/20/17 182 lb 12.8 oz (82.9 kg)      ASSESSMENT AND PLAN:  Paroxysmal atrial flutter (HCC) - Plan: EKG 12-Lead, CT CARDIAC SCORING In general is asymptomatic from his flutter Recommend he try not to take too much sotalol other than what is prescribed He was taking extra half doses as needed for rare palpitations Extra doses likely contributing to worsening bradycardia Current heart rate in the 102s and he is asymptomatic -We did offer a 2-week extended monitor given extra beats, rare palpitations, and known history of flutter, he has declined at this time but will call us if symptoms get worse  Family history of early CAD - Plan: CT CARDIAC SCORING Discussed CT coronary calcium scoring for risk stratification He will call us if he would like this done Information provided on the test  Mixed hyperlipidemia Cholesterol slightly above 200 We did discuss risk stratification studies with him in detail  Essential hypertension High in the office on today's visit but reports is well controlled at home No changes made to his medications  Elevated glucose We have encouraged continued exercise, careful diet management in an effort to lose weight.  Former smoker Likely with mild COPD, high risk for coronary disease Calcium score discussed with him  PVC's (premature ventricular contractions) Likely having ectopy either APCs or PVCs These are quite bothersome to  him and he is taking extra half doses of sotalol Long discussion with him, if he would like a short acting as needed pill suggested he try propranolol 10 to 20 mg as needed and to stay on his sotalol 120 twice daily -We did offer monitor/zio patch.  He has declined at this time  Disposition:   F/U as needed   Total encounter time more than 60 minutes  Greater than 50% was spent in counseling and coordination of care with the patient   Orders Placed This Encounter  Procedures  . CT CARDIAC SCORING  . EKG 12-Lead     Signed, Esmond Plants, M.D., Ph.D. 12/28/2018  [place], [place]

## 2018-12-28 ENCOUNTER — Ambulatory Visit (INDEPENDENT_AMBULATORY_CARE_PROVIDER_SITE_OTHER): Payer: Medicare Other | Admitting: Cardiovascular Disease

## 2018-12-28 ENCOUNTER — Other Ambulatory Visit: Payer: Self-pay

## 2018-12-28 ENCOUNTER — Encounter: Payer: Self-pay | Admitting: Cardiovascular Disease

## 2018-12-28 VITALS — BP 155/85 | HR 55 | Ht 73.0 in | Wt 188.2 lb

## 2018-12-28 DIAGNOSIS — E782 Mixed hyperlipidemia: Secondary | ICD-10-CM | POA: Insufficient documentation

## 2018-12-28 DIAGNOSIS — I4892 Unspecified atrial flutter: Secondary | ICD-10-CM | POA: Diagnosis not present

## 2018-12-28 DIAGNOSIS — I493 Ventricular premature depolarization: Secondary | ICD-10-CM | POA: Insufficient documentation

## 2018-12-28 DIAGNOSIS — Z8249 Family history of ischemic heart disease and other diseases of the circulatory system: Secondary | ICD-10-CM | POA: Insufficient documentation

## 2018-12-28 DIAGNOSIS — I10 Essential (primary) hypertension: Secondary | ICD-10-CM

## 2018-12-28 DIAGNOSIS — Z87891 Personal history of nicotine dependence: Secondary | ICD-10-CM | POA: Insufficient documentation

## 2018-12-28 DIAGNOSIS — R7309 Other abnormal glucose: Secondary | ICD-10-CM

## 2018-12-28 MED ORDER — PROPRANOLOL HCL 20 MG PO TABS: 20.0000 mg | ORAL_TABLET | Freq: Three times a day (TID) | ORAL | 2 refills | Status: DC | PRN

## 2018-12-28 NOTE — Patient Instructions (Addendum)
If you would like a ZIO patch/monitor 2 wks Please call if you would like one  Read about CT coronary calcium score $150, [place] You may call [phone] to schedule at your convenience. You may need to wait until after COVID 19.    Medication Instructions:  Please take propranolol 20 mg TID as needed for extra beats For ectopy I think you are having PVCs PACs  If you need a refill on your cardiac medications before your next appointment, please call your pharmacy.    Lab work: No new labs needed   If you have labs (blood work) drawn today and your tests are completely normal, you will receive your results only by: Marland Kitchen MyChart Message (if you have MyChart) OR . A paper copy in the mail If you have any lab test that is abnormal or we need to change your treatment, we will call you to review the results.   Testing/Procedures: No new testing needed   Follow-Up: At [facility], you and your health needs are our priority.  As part of our continuing mission to provide you with exceptional heart care, we have created designated Provider Care Teams.  These Care Teams include your primary Cardiologist (physician) and Advanced Practice Providers (APPs -  Physician Assistants and Nurse Practitioners) who all work together to provide you with the care you need, when you need it.  You will need a follow up appointment as needed  . Providers on your designated Care Team:   . Murray Hodgkins, NP . Christell Faith, PA-C . Marrianne Mood, PA-C  Any Other Special Instructions Will Be Listed Below (If Applicable).  For educational health videos Log in to : [email or web address] Or : [email or web address], password : [email or web address]

## 2018-12-31 ENCOUNTER — Telehealth: Payer: Self-pay | Admitting: Cardiovascular Disease

## 2018-12-31 NOTE — Telephone Encounter (Signed)
Registration completed on Zio site to be shipped to patient.

## 2018-12-31 NOTE — Telephone Encounter (Signed)
Pt would like to get a ZIO monitor . Please call to discuss .

## 2018-12-31 NOTE — Telephone Encounter (Signed)
Spoke with patient and he wants to go ahead and wear a monitor. Advised that I would have the monitor shipped to his home and to please give [place] a call back if he should have any further questions with this.

## 2019-01-03 ENCOUNTER — Ambulatory Visit (INDEPENDENT_AMBULATORY_CARE_PROVIDER_SITE_OTHER): Payer: Medicare Other

## 2019-01-03 DIAGNOSIS — I4892 Unspecified atrial flutter: Secondary | ICD-10-CM | POA: Diagnosis not present

## 2019-01-22 ENCOUNTER — Telehealth: Payer: Self-pay | Admitting: *Deleted

## 2019-01-22 NOTE — Telephone Encounter (Signed)
Incoming call from I rhythm with a final report from the patient's monitor.  They stated that the patient had one episode of symptomatic bradycardia at 35 bpm that lasted 30 seconds and 10 episodes of av block.   Call was placed to the patient to check on how he was feeling. He stated that he felt fine and no different than he normally does. He stated that he feels fine when he is ambulating and walking for exercise. He feels like his heart rate drops when he is resting but was not symptomatic.  The report has been printed and left for the ordering provider and DOD has been made aware. The message will be sent to the ordering provider for his knowledge.

## 2019-01-23 ENCOUNTER — Other Ambulatory Visit: Payer: Self-pay | Admitting: *Deleted

## 2019-01-23 ENCOUNTER — Telehealth: Payer: Self-pay | Admitting: Internal Medicine

## 2019-01-23 DIAGNOSIS — I4892 Unspecified atrial flutter: Secondary | ICD-10-CM

## 2019-01-23 NOTE — Telephone Encounter (Signed)
I have reviewed the rhythm strips Some sinus bradycardia it appears overnight possibly when he is sleeping,  In addition has some second-degree AV block type II before 9 AM , and clear if he is sleeping at that time/napping Obviously has AV nodal disease And has history of atrial flutter per the notes On sotalol  Easiest thing would be to cut back on his sotalol dosing Probably should be under the guidance of EP in case flutter comes back and he needs further medication or intervention Can we set him up to see EP

## 2019-01-23 NOTE — Telephone Encounter (Signed)
Virtual Visit Pre-Appointment Phone Call  "(Name), I am calling you today to discuss your upcoming appointment. We are currently trying to limit exposure to the virus that causes COVID-19 by seeing patients at home rather than in the office."  1. "What is the BEST phone number to call the day of the visit?" - include this in appointment notes  2. Do you have or have access to (through a family member/friend) a smartphone with video capability that we can use for your visit?" a. If yes - list this number in appt notes as cell (if different from BEST phone #) and list the appointment type as a VIDEO visit in appointment notes b. If no - list the appointment type as a PHONE visit in appointment notes  3. Confirm consent - "In the setting of the current Covid19 crisis, you are scheduled for a (phone or video) visit with your provider on (date) at (time).  Just as we do with many in-office visits, in order for you to participate in this visit, we must obtain consent.  If you'd like, I can send this to your mychart (if signed up) or email for you to review.  Otherwise, I can obtain your verbal consent now.  All virtual visits are billed to your insurance company just like a normal visit would be.  By agreeing to a virtual visit, we'd like you to understand that the technology does not allow for your provider to perform an examination, and thus may limit your provider's ability to fully assess your condition. If your provider identifies any concerns that need to be evaluated in person, we will make arrangements to do so.  Finally, though the technology is pretty good, we cannot assure that it will always work on either your or our end, and in the setting of a video visit, we may have to convert it to a phone-only visit.  In either situation, we cannot ensure that we have a secure connection.  Are you willing to proceed?" STAFF: Did the patient verbally acknowledge consent to telehealth visit? Document  YES/NO here: Yes  4. Advise patient to be prepared - "Two hours prior to your appointment, go ahead and check your blood pressure, pulse, oxygen saturation, and your weight (if you have the equipment to check those) and write them all down. When your visit starts, your provider will ask you for this information. If you have an Apple Watch or Kardia device, please plan to have heart rate information ready on the day of your appointment. Please have a pen and paper handy nearby the day of the visit as well."  5. Give patient instructions for MyChart download to smartphone OR Doximity/Doxy.me as below if video visit (depending on what platform provider is using)  6. Inform patient they will receive a phone call 15 minutes prior to their appointment time (may be from unknown caller ID) so they should be prepared to answer    TELEPHONE CALL NOTE  Kerry Rowland has been deemed a candidate for a follow-up tele-health visit to limit community exposure during the Covid-19 pandemic. I spoke with the patient via phone to ensure availability of phone/video source, confirm preferred email & phone number, and discuss instructions and expectations.  I reminded KENDAN CORNFORTH to be prepared with any vital sign and/or heart rhythm information that could potentially be obtained via home monitoring, at the time of his visit. I reminded FERRIS FIELDEN to expect a phone call prior to  his visit.  Ace Gins 01/23/2019 9:14 AM   INSTRUCTIONS FOR DOWNLOADING THE MYCHART APP TO SMARTPHONE  - The patient must first make sure to have activated MyChart and know their login information - If Apple, go to [employer] and type in MyChart in the search bar and download the app. If Android, ask patient to go to [employer] and type in [place] in the search bar and download the app. The app is free but as with any other app downloads, their phone may require them to verify saved payment information or Apple/Android  password.  - The patient will need to then log into the app with their MyChart username and password, and select [facility] as their healthcare provider to link the account. When it is time for your visit, go to the MyChart app, find appointments, and click Begin Video Visit. Be sure to Select Allow for your device to access the Microphone and Camera for your visit. You will then be connected, and your provider will be with you shortly.  **If they have any issues connecting, or need assistance please contact MyChart service desk [phone])**  **If using a computer, in order to ensure the best quality for their visit they will need to use either of the following Internet Browsers: [employer], or Google Chrome**  IF USING DOXIMITY or DOXY.ME - The patient will receive a link just prior to their visit by text.     FULL LENGTH CONSENT FOR TELE-HEALTH VISIT   I hereby voluntarily request, consent and authorize [facility] and its employed or contracted physicians, physician assistants, nurse practitioners or other licensed health care professionals (the Practitioner), to provide me with telemedicine health care services (the Services") as deemed necessary by the treating Practitioner. I acknowledge and consent to receive the Services by the Practitioner via telemedicine. I understand that the telemedicine visit will involve communicating with the Practitioner through live audiovisual communication technology and the disclosure of certain medical information by electronic transmission. I acknowledge that I have been given the opportunity to request an in-person assessment or other available alternative prior to the telemedicine visit and am voluntarily participating in the telemedicine visit.  I understand that I have the right to withhold or withdraw my consent to the use of telemedicine in the course of my care at any time, without affecting my right to future care or treatment,  and that the Practitioner or I may terminate the telemedicine visit at any time. I understand that I have the right to inspect all information obtained and/or recorded in the course of the telemedicine visit and may receive copies of available information for a reasonable fee.  I understand that some of the potential risks of receiving the Services via telemedicine include:   Delay or interruption in medical evaluation due to technological equipment failure or disruption;  Information transmitted may not be sufficient (e.g. poor resolution of images) to allow for appropriate medical decision making by the Practitioner; and/or   In rare instances, security protocols could fail, causing a breach of personal health information.  Furthermore, I acknowledge that it is my responsibility to provide information about my medical history, conditions and care that is complete and accurate to the best of my ability. I acknowledge that Practitioner's advice, recommendations, and/or decision may be based on factors not within their control, such as incomplete or inaccurate data provided by me or distortions of diagnostic images or specimens that may result from electronic transmissions. I understand  that the practice of medicine is not an exact science and that Practitioner makes no warranties or guarantees regarding treatment outcomes. I acknowledge that I will receive a copy of this consent concurrently upon execution via email to the email address I last provided but may also request a printed copy by calling the office of [place].    I understand that my insurance will be billed for this visit.   I have read or had this consent read to me.  I understand the contents of this consent, which adequately explains the benefits and risks of the Services being provided via telemedicine.   I have been provided ample opportunity to ask questions regarding this consent and the Services and have had my questions  answered to my satisfaction.  I give my informed consent for the services to be provided through the use of telemedicine in my medical care  By participating in this telemedicine visit I agree to the above.

## 2019-01-23 NOTE — Telephone Encounter (Signed)
Spoke with the pt and made him aware of Dr. Donivan Scull recommendation. Adv the pt that due to COVID-19 we are scheduling telemedicine visits in order to reduce pt risk. Pt is agreeable with having a virtual consult appt with Dr. Caryl Comes. He has completed his zio-monitor and it has been mailed back to the company.  Pt is asymptomatic. Pt sts that he does have access to a smart device. Adv the pt that he will be contacted to schedule his virtual visit with Dr.Klein.

## 2019-01-23 NOTE — Telephone Encounter (Signed)
Contacted the pt to give Dr.Gollan's recommendation. [email or web address].

## 2019-02-07 ENCOUNTER — Telehealth (INDEPENDENT_AMBULATORY_CARE_PROVIDER_SITE_OTHER): Payer: Medicare Other | Admitting: Internal Medicine

## 2019-02-07 ENCOUNTER — Other Ambulatory Visit: Payer: Self-pay

## 2019-02-07 VITALS — HR 57 | Ht 74.0 in | Wt 180.0 lb

## 2019-02-07 DIAGNOSIS — I4892 Unspecified atrial flutter: Secondary | ICD-10-CM

## 2019-02-07 DIAGNOSIS — R001 Bradycardia, unspecified: Secondary | ICD-10-CM

## 2019-02-07 DIAGNOSIS — I441 Atrioventricular block, second degree: Secondary | ICD-10-CM

## 2019-02-07 DIAGNOSIS — Z01812 Encounter for preprocedural laboratory examination: Secondary | ICD-10-CM

## 2019-02-07 DIAGNOSIS — R5383 Other fatigue: Secondary | ICD-10-CM

## 2019-02-07 NOTE — Patient Instructions (Addendum)
Medication Instructions:  - Your physician recommends that you continue on your current medications as directed. Please refer to the Current Medication list given to you today.  If you need a refill on your cardiac medications before your next appointment, please call your pharmacy.   Lab work: - Your physician recommends that you have lab work: Friday 02/22/2019- BMP/ CBC- will be done in the office when you have your echo done.  - After you have your echo & labs done in our office, you will need to get in your car and drive up to the [facility] entrance for your pre-procedure COVID-19 swab to be done, this will be the last thing you do that day  If you have labs (blood work) drawn today and your tests are completely normal, you will receive your results only by: Marland Kitchen MyChart Message (if you have MyChart) OR . A paper copy in the mail If you have any lab test that is abnormal or we need to change your treatment, we will call you to review the results.  Testing/Procedures: - Your physician has requested that you have an echocardiogram (Friday 02/22/19 at 9:00 am) . Echocardiography is a painless test that uses sound waves to create images of your heart. It provides your doctor with information about the size and shape of your heart and how well your heart's chambers and valves are working. This procedure takes approximately one hour. There are no restrictions for this procedure.  (you will need to wear a mask when you come in for the echo/ labs to be done)   - Your physician has recommended that you have a pacemaker inserted. A pacemaker is a small device that is placed under the skin of your chest or abdomen to help control abnormal heart rhythms. This device uses electrical pulses to prompt the heart to beat at a normal rate. Pacemakers are used to treat heart rhythms that are too slow. Wire (leads) are attached to the pacemaker that goes into the chambers of you heart. This is done in the  hospital and usually requires and overnight stay.  (see separate letter of instructions sent through [place])   Follow-Up: At [facility], you and your health needs are our priority.  As part of our continuing mission to provide you with exceptional heart care, we have created designated Provider Care Teams.  These Care Teams include your primary Cardiologist (physician) and Advanced Practice Providers (APPs -  Physician Assistants and Nurse Practitioners) who all work together to provide you with the care you need, when you need it.  You will need a follow up appointment: 10-14 days (from 02/25/19) for a wound check with the Device Nurse in the [place] office.  You will need a follow up appointment in:  91 days from 02/25/2019 with Dr. Caryl Comes Please call our office 2 months in advance to schedule this appointment.    Any Other Special Instructions Will Be Listed Below (If Applicable). - N/A   Echocardiogram An echocardiogram is a procedure that uses painless sound waves (ultrasound) to produce an image of the heart. Images from an echocardiogram can provide important information about:  Signs of coronary artery disease (CAD).  Aneurysm detection. An aneurysm is a weak or damaged part of an artery wall that bulges out from the normal force of blood pumping through the body.  Heart size and shape. Changes in the size or shape of the heart can be associated with certain conditions, including heart failure, aneurysm, and  CAD.  Heart muscle function.  Heart valve function.  Signs of a past heart attack.  Fluid buildup around the heart.  Thickening of the heart muscle.  A tumor or infectious growth around the heart valves. Tell a health care provider about:  Any allergies you have.  All medicines you are taking, including vitamins, herbs, eye drops, creams, and over-the-counter medicines.  Any blood disorders you have.  Any surgeries you have had.  Any medical conditions you  have.  Whether you are pregnant or may be pregnant. What are the risks? Generally, this is a safe procedure. However, problems may occur, including:  Allergic reaction to dye (contrast) that may be used during the procedure. What happens before the procedure? No specific preparation is needed. You may eat and drink normally. What happens during the procedure?   An IV tube may be inserted into one of your veins.  You may receive contrast through this tube. A contrast is an injection that improves the quality of the pictures from your heart.  A gel will be applied to your chest.  A wand-like tool (transducer) will be moved over your chest. The gel will help to transmit the sound waves from the transducer.  The sound waves will harmlessly bounce off of your heart to allow the heart images to be captured in real-time motion. The images will be recorded on a computer. The procedure may vary among health care providers and hospitals. What happens after the procedure?  You may return to your normal, everyday life, including diet, activities, and medicines, unless your health care provider tells you not to do that. Summary  An echocardiogram is a procedure that uses painless sound waves (ultrasound) to produce an image of the heart.  Images from an echocardiogram can provide important information about the size and shape of your heart, heart muscle function, heart valve function, and fluid buildup around your heart.  You do not need to do anything to prepare before this procedure. You may eat and drink normally.  After the echocardiogram is completed, you may return to your normal, everyday life, unless your health care provider tells you not to do that. This information is not intended to replace advice given to you by your health care provider. Make sure you discuss any questions you have with your health care provider. Document Released: 09/09/2000 Document Revised: 10/15/2016 Document  Reviewed: 10/15/2016 Elsevier Interactive Patient Education  2019 [employer] Inc[employer]   Pacemaker Implantation, Adult Pacemaker implantation is a procedure to place a pacemaker inside your chest. A pacemaker is a small computer that sends electrical signals to the heart and helps your heart beat normally. A pacemaker also stores information about your heart rhythms. You may need pacemaker implantation if you:  Have a slow heartbeat (bradycardia).  Faint (syncope).  Have shortness of breath (dyspnea) due to heart problems. The pacemaker attaches to your heart through a wire, called a lead. Sometimes just one lead is needed. Other times, there will be two leads. There are two types of pacemakers:  Transvenous pacemaker. This type is placed under the skin or muscle of your chest. The lead goes through a vein in the chest area to reach the inside of the heart.  Epicardial pacemaker. This type is placed under the skin or muscle of your chest or belly. The lead goes through your chest to the outside of the heart. Tell a health care provider about:  Any allergies you have.  All medicines you are taking, including  vitamins, herbs, eye drops, creams, and over-the-counter medicines.  Any problems you or family members have had with anesthetic medicines.  Any blood or bone disorders you have.  Any surgeries you have had.  Any medical conditions you have.  Whether you are pregnant or may be pregnant. What are the risks? Generally, this is a safe procedure. However, problems may occur, including:  Infection.  Bleeding.  Failure of the pacemaker or the lead.  Collapse of a lung or bleeding into a lung.  Blood clot inside a blood vessel with a lead.  Damage to the heart.  Infection inside the heart (endocarditis).  Allergic reactions to medicines. What happens before the procedure? Staying hydrated Follow instructions from your health care provider about hydration, which may include:   Up to 2 hours before the procedure - you may continue to drink clear liquids, such as water, clear fruit juice, black coffee, and plain tea. Eating and drinking restrictions Follow instructions from your health care provider about eating and drinking, which may include:  8 hours before the procedure - stop eating heavy meals or foods such as meat, fried foods, or fatty foods.  6 hours before the procedure - stop eating light meals or foods, such as toast or cereal.  6 hours before the procedure - stop drinking milk or drinks that contain milk.  2 hours before the procedure - stop drinking clear liquids. Medicines  Ask your health care provider about: ? Changing or stopping your regular medicines. This is especially important if you are taking diabetes medicines or blood thinners. ? Taking medicines such as aspirin and ibuprofen. These medicines can thin your blood. Do not take these medicines before your procedure if your health care provider instructs you not to.  You may be given antibiotic medicine to help prevent infection. General instructions  You will have a heart evaluation. This may include an electrocardiogram (ECG), chest X-ray, and heart imaging (echocardiogram,  or echo) tests.  You will have blood tests.  Do not use any products that contain nicotine or tobacco, such as cigarettes and e-cigarettes. If you need help quitting, ask your health care provider.  Plan to have someone take you home from the hospital or clinic.  If you will be going home right after the procedure, plan to have someone with you for 24 hours.  Ask your health care provider how your surgical site will be marked or identified. What happens during the procedure?  To reduce your risk of infection: ? Your health care team will wash or sanitize their hands. ? Your skin will be washed with soap. ? Hair may be removed from the surgical area.  An IV tube will be inserted into one of your veins.   You will be given one or more of the following: ? A medicine to help you relax (sedative). ? A medicine to numb the area (local anesthetic). ? A medicine to make you fall asleep (general anesthetic).  If you are getting a transvenous pacemaker: ? An incision will be made in your upper chest. ? A pocket will be made for the pacemaker. It may be placed under the skin or between layers of muscle. ? The lead will be inserted into a blood vessel that returns to the heart. ? While X-rays are taken by an imaging machine (fluoroscopy), the lead will be advanced through the vein to the inside of your heart. ? The other end of the lead will be tunneled under the skin and  attached to the pacemaker.  If you are getting an epicardial pacemaker: ? An incision will be made near your ribs or breastbone (sternum) for the lead. ? The lead will be attached to the outside of your heart. ? Another incision will be made in your chest or upper belly to create a pocket for the pacemaker. ? The free end of the lead will be tunneled under the skin and attached to the pacemaker.  The transvenous or epicardial pacemaker will be tested. Imaging studies may be done to check the lead position.  The incisions will be closed with stitches (sutures), adhesive strips, or skin glue.  Bandages (dressing) will be placed over the incisions. The procedure may vary among health care providers and hospitals. What happens after the procedure?  Your blood pressure, heart rate, breathing rate, and blood oxygen level will be monitored until the medicines you were given have worn off.  You will be given antibiotics and pain medicine.  ECG and chest x-rays will be done.  You will wear a continuous type of ECG (Holter monitor) to check your heart rhythm.  Your health care provider will program the pacemaker.  Do not drive for 24 hours if you received a sedative. This information is not intended to replace advice given to you by  your health care provider. Make sure you discuss any questions you have with your health care provider. Document Released: 09/02/2002 Document Revised: 06/01/2018 Document Reviewed: 02/24/2016 Elsevier Interactive Patient Education  2019 [employer].

## 2019-02-07 NOTE — H&P (View-Only) (Signed)
Electrophysiology TeleHealth Note   Due to national recommendations of social distancing due to COVID 19, an audio/video telehealth visit is felt to be most appropriate for this patient at this time.  See MyChart message from today for the patient's consent to telehealth for [facility].   Date:  02/07/2019   ID:  Kerry Rowland, DOB [birth year], MRN [id number]  Location: patient's home  Provider location: [address], [place]  Evaluation Performed: Follow-up visit  PCP:  Adin Hector, MD  Cardiologist:   TTG Electrophysiologist:  SK   Chief Complaint:  Atrial fib  History of Present Illness:    Kerry Rowland is a 83 y.o. male who presents via audio/video conferencing for a telehealth visit today.  The patient is seen in consultation for atrial flutter.  Initially diagnosed at the turn of the century, he recalls being told he had atrial flutter.  He was cardioverted and treated with sotalol.  It was discontinued within the next year or 2 when he had recurrence of what he said was atrial fibrillation.  Sotalol was resumed.  He was cardioverted again.  Anticoagulation was then discontinued.  He has had no persistent atrial arrhythmias since.  He has no since discontinuation been on anticoagulation again.  Over recent months he has noted a gradual decrease in his generally low heart rate from the 60s--50s and then 40s and most recently the 30s.  These occur both off of recordings that happen at night as well as during the day.  Over the last 6 months when he has noted this change he has had much more notable fatigue, need to sleep and generalized lassitude.  Perhaps some shortness of breath but is still able to climb stairs to the second floor without difficulty.  No edema or chest pain.  No syncope or presyncope  Think it might be what is Betapace, he self titrated his dose downward and ended up with more palpitations.  His event recorder was reviewed  personally with the patient.  Mean heart rates during the day are approximately 50.  Max heart rates during the day are proximal 70.  The heart rates are in the 20s and 30s.  Sinus bradycardia with second-degree type I AV block was noted.  The report describes third-degree AV block but I was not able to find that strip.    The patient denies symptoms of fevers, chills, cough, or new SOB worrisome for COVID 19.    Past Medical History:  Diagnosis Date  . Acute idiopathic gout of right foot   . Colon polyps   . Constipation   . Erectile dysfunction   . Generalized abdominal mass   . Hyperglycemia   . Osteoarthritis   . Paroxysmal atrial flutter ([place])   . Raynaud's phenomenon   . Skin cancer     Past Surgical History:  Procedure Laterality Date  . APPENDECTOMY    . CATARACT EXTRACTION    . COLONOSCOPY WITH PROPOFOL N/A 05/22/2015   Procedure: COLONOSCOPY WITH PROPOFOL;  Surgeon: Manya Silvas, MD;  Location: [facility] ENDOSCOPY;  Service: Endoscopy;  Laterality: N/A;  . skin grafts    . TONSILLECTOMY      Current Outpatient Medications  Medication Sig Dispense Refill  . cetirizine (ZYRTEC) 10 MG chewable tablet Chew 10 mg by mouth daily.    . cyclobenzaprine (FLEXERIL) 10 MG tablet Take 10 mg by mouth 3 (three) times daily as needed for muscle spasms.    Marland Kitchen  finasteride (PROSCAR) 5 MG tablet Take 1 tablet (5 mg total) by mouth daily. 90 tablet 4  . Multiple Vitamin (MULTI-VITAMINS) TABS Take by mouth.    . propranolol (INDERAL) 20 MG tablet Take 1 tablet (20 mg total) by mouth 3 (three) times daily as needed. For extra beats. 90 tablet 2  . sotalol (BETAPACE) 120 MG tablet Take 120 mg by mouth 2 (two) times daily.    . tamsulosin (FLOMAX) 0.4 MG CAPS capsule TAKE 1 CAPSULE ONCE AS DIRECTED 90 capsule 4   No current facility-administered medications for this visit.     Allergies:   Morphine and related; Nsaids; and Phenergan [promethazine hcl]   Social History:  The patient   reports that he has quit smoking. His smoking use included cigarettes. He has never used smokeless tobacco. He reports that he does not drink alcohol or use drugs.   Family History:  The patient's   family history is not on file.   ROS:  Please see the history of present illness.   All other systems are personally reviewed and negative.    Exam:    Vital Signs:  Pulse (!) 57   Ht 6\' 2"  (1.88 m)   Wt 180 lb (81.6 kg)   BMI 23.11 kg/m     Well appearing, alert and conversant, regular work of breathing,  good skin color Eyes- anicteric, neuro- grossly intact, skin- no apparent rash or lesions or cyanosis, mouth- oral mucosa is pink   Labs/Other Tests and Data Reviewed:    Recent Labs: No results found for requested labs within last 8760 hours.   Wt Readings from Last 3 Encounters:  02/07/19 180 lb (81.6 kg)  12/28/18 188 lb 4 oz (85.4 kg)  04/18/18 165 lb (74.8 kg)     Other studies personally reviewed: Additional studies/ records that were reviewed today include:office records  ECG personally reviewed 4/20  sinus at 57 25/12/44 ASSESSMENT & PLAN:    Atrial flutter/fibrillation-by history  Sinus bradycardia/first-degree AV block  AV block 2: 1 mostly type I  Fatigue  The patient had progressive bradycardia as he is noted on his apple watch.  This is been associated with progressive fatigue and tiredness with more naps.  In efforts to address this, patient initiated dose reduction of sotalol resulted in more palpitations.  His bradycardia has emerged after long term exposure to Betapace suggesting progressive sinus node dysfunction.  Heart rate excursion is from 30--70, clearly chronotropically incompetence.  He is not sure whether he had atrial fibrillation or atrial flutter back at the turn of the century.  He is also unaware of interval episodes of atrial arrhythmia.  We discussed the importance of anticoagulation; however, in the absence of evidence of the arrhythmia  in recent years it is hard to consider initiating.  Pacing will allow atrial rhythm surveillance.  The benefits and risks were reviewed including but not limited to death,  perforation, infection, lead dislodgement and device malfunction.  The patient understands agrees and is willing to proceed.    COVID 19 screen The patient denies symptoms of COVID 19 at this time.  The importance of social distancing was discussed today.  Follow-up: post pacemaker insertion 3 months     Current medicines are reviewed at length with the patient today.   The patient does not have concerns regarding his medicines.  The following changes were made today:  none  Labs/ tests ordered today include: preop labs If we can also order an echo,  did not talk to him about this but would inform std PM v CRT No orders of the defined types were placed in this encounter.   Future tests ( post COVID )     Patient Risk:  after full review of this patients clinical status, I feel that they are at moderate  risk at this time.  Today, I have spent 34 minutes with the patient with telehealth technology discussing the above.  Signed, [name], MD  02/07/2019 9:55 AM     [address] [place] [address] [phone] (office) [phone] (fax)

## 2019-02-07 NOTE — Progress Notes (Signed)
Electrophysiology TeleHealth Note   Due to national recommendations of social distancing due to COVID 19, an audio/video telehealth visit is felt to be most appropriate for this patient at this time.  See MyChart message from today for the patient's consent to telehealth for [facility].   Date:  02/07/2019   ID:  Kerry Rowland, DOB 1936/03/04, MRN [id number]  Location: patient's home  Provider location: [address], [place]  Evaluation Performed: Follow-up visit  PCP:  Adin Hector, MD  Cardiologist:   TTG Electrophysiologist:  SK   Chief Complaint:  Atrial fib  History of Present Illness:    Kerry Rowland is a 83 y.o. male who presents via audio/video conferencing for a telehealth visit today.  The patient is seen in consultation for atrial flutter.  Initially diagnosed at the turn of the century, he recalls being told he had atrial flutter.  He was cardioverted and treated with sotalol.  It was discontinued within the next year or 2 when he had recurrence of what he said was atrial fibrillation.  Sotalol was resumed.  He was cardioverted again.  Anticoagulation was then discontinued.  He has had no persistent atrial arrhythmias since.  He has no since discontinuation been on anticoagulation again.  Over recent months he has noted a gradual decrease in his generally low heart rate from the 60s--50s and then 40s and most recently the 30s.  These occur both off of recordings that happen at night as well as during the day.  Over the last 6 months when he has noted this change he has had much more notable fatigue, need to sleep and generalized lassitude.  Perhaps some shortness of breath but is still able to climb stairs to the second floor without difficulty.  No edema or chest pain.  No syncope or presyncope  Think it might be what is Betapace, he self titrated his dose downward and ended up with more palpitations.  His event recorder was reviewed  personally with the patient.  Mean heart rates during the day are approximately 50.  Max heart rates during the day are proximal 70.  The heart rates are in the 20s and 30s.  Sinus bradycardia with second-degree type I AV block was noted.  The report describes third-degree AV block but I was not able to find that strip.    The patient denies symptoms of fevers, chills, cough, or new SOB worrisome for COVID 19.    Past Medical History:  Diagnosis Date  . Acute idiopathic gout of right foot   . Colon polyps   . Constipation   . Erectile dysfunction   . Generalized abdominal mass   . Hyperglycemia   . Osteoarthritis   . Paroxysmal atrial flutter ([place])   . Raynaud's phenomenon   . Skin cancer     Past Surgical History:  Procedure Laterality Date  . APPENDECTOMY    . CATARACT EXTRACTION    . COLONOSCOPY WITH PROPOFOL N/A 05/22/2015   Procedure: COLONOSCOPY WITH PROPOFOL;  Surgeon: Manya Silvas, MD;  Location: [facility] ENDOSCOPY;  Service: Endoscopy;  Laterality: N/A;  . skin grafts    . TONSILLECTOMY      Current Outpatient Medications  Medication Sig Dispense Refill  . cetirizine (ZYRTEC) 10 MG chewable tablet Chew 10 mg by mouth daily.    . cyclobenzaprine (FLEXERIL) 10 MG tablet Take 10 mg by mouth 3 (three) times daily as needed for muscle spasms.    Marland Kitchen  finasteride (PROSCAR) 5 MG tablet Take 1 tablet (5 mg total) by mouth daily. 90 tablet 4  . Multiple Vitamin (MULTI-VITAMINS) TABS Take by mouth.    . propranolol (INDERAL) 20 MG tablet Take 1 tablet (20 mg total) by mouth 3 (three) times daily as needed. For extra beats. 90 tablet 2  . sotalol (BETAPACE) 120 MG tablet Take 120 mg by mouth 2 (two) times daily.    . tamsulosin (FLOMAX) 0.4 MG CAPS capsule TAKE 1 CAPSULE ONCE AS DIRECTED 90 capsule 4   No current facility-administered medications for this visit.     Allergies:   Morphine and related; Nsaids; and Phenergan [promethazine hcl]   Social History:  The patient   reports that he has quit smoking. His smoking use included cigarettes. He has never used smokeless tobacco. He reports that he does not drink alcohol or use drugs.   Family History:  The patient's   family history is not on file.   ROS:  Please see the history of present illness.   All other systems are personally reviewed and negative.    Exam:    Vital Signs:  Pulse (!) 57   Ht 6\' 2"  (1.88 m)   Wt 180 lb (81.6 kg)   BMI 23.11 kg/m     Well appearing, alert and conversant, regular work of breathing,  good skin color Eyes- anicteric, neuro- grossly intact, skin- no apparent rash or lesions or cyanosis, mouth- oral mucosa is pink   Labs/Other Tests and Data Reviewed:    Recent Labs: No results found for requested labs within last 8760 hours.   Wt Readings from Last 3 Encounters:  02/07/19 180 lb (81.6 kg)  12/28/18 188 lb 4 oz (85.4 kg)  04/18/18 165 lb (74.8 kg)     Other studies personally reviewed: Additional studies/ records that were reviewed today include:office records  ECG personally reviewed 4/20  sinus at 57 25/12/44 ASSESSMENT & PLAN:    Atrial flutter/fibrillation-by history  Sinus bradycardia/first-degree AV block  AV block 2: 1 mostly type I  Fatigue  The patient had progressive bradycardia as he is noted on his apple watch.  This is been associated with progressive fatigue and tiredness with more naps.  In efforts to address this, patient initiated dose reduction of sotalol resulted in more palpitations.  His bradycardia has emerged after long term exposure to Betapace suggesting progressive sinus node dysfunction.  Heart rate excursion is from 30--70, clearly chronotropically incompetence.  He is not sure whether he had atrial fibrillation or atrial flutter back at the turn of the century.  He is also unaware of interval episodes of atrial arrhythmia.  We discussed the importance of anticoagulation; however, in the absence of evidence of the arrhythmia  in recent years it is hard to consider initiating.  Pacing will allow atrial rhythm surveillance.  The benefits and risks were reviewed including but not limited to death,  perforation, infection, lead dislodgement and device malfunction.  The patient understands agrees and is willing to proceed.    COVID 19 screen The patient denies symptoms of COVID 19 at this time.  The importance of social distancing was discussed today.  Follow-up: post pacemaker insertion 3 months     Current medicines are reviewed at length with the patient today.   The patient does not have concerns regarding his medicines.  The following changes were made today:  none  Labs/ tests ordered today include: preop labs If we can also order an echo,  did not talk to him about this but would inform std PM v CRT No orders of the defined types were placed in this encounter.   Future tests ( post COVID )     Patient Risk:  after full review of this patients clinical status, I feel that they are at moderate  risk at this time.  Today, I have spent 34 minutes with the patient with telehealth technology discussing the above.  Signed, [name], MD  02/07/2019 9:55 AM     [address] [place]  [place] [address] [phone] (office) [phone] (fax)

## 2019-02-08 ENCOUNTER — Encounter: Payer: Self-pay | Admitting: *Deleted

## 2019-02-08 NOTE — Addendum Note (Signed)
Addended by: Alvis Lemmings C on: 02/08/2019 02:25 PM   Modules accepted: Orders

## 2019-02-20 ENCOUNTER — Other Ambulatory Visit: Payer: Self-pay | Admitting: *Deleted

## 2019-02-20 DIAGNOSIS — Z01812 Encounter for preprocedural laboratory examination: Secondary | ICD-10-CM

## 2019-02-20 DIAGNOSIS — M8949 Other hypertrophic osteoarthropathy, multiple sites: Secondary | ICD-10-CM

## 2019-02-20 DIAGNOSIS — R001 Bradycardia, unspecified: Secondary | ICD-10-CM

## 2019-02-20 DIAGNOSIS — E7800 Pure hypercholesterolemia, unspecified: Secondary | ICD-10-CM

## 2019-02-20 DIAGNOSIS — I4892 Unspecified atrial flutter: Secondary | ICD-10-CM

## 2019-02-20 DIAGNOSIS — M1A00X0 Idiopathic chronic gout, unspecified site, without tophus (tophi): Secondary | ICD-10-CM

## 2019-02-20 DIAGNOSIS — R739 Hyperglycemia, unspecified: Secondary | ICD-10-CM

## 2019-02-22 ENCOUNTER — Ambulatory Visit (INDEPENDENT_AMBULATORY_CARE_PROVIDER_SITE_OTHER): Payer: Medicare Other

## 2019-02-22 ENCOUNTER — Other Ambulatory Visit: Payer: Self-pay

## 2019-02-22 ENCOUNTER — Other Ambulatory Visit
Admission: RE | Admit: 2019-02-22 | Discharge: 2019-02-22 | Disposition: A | Discharge status: Home / Self Care | Payer: Medicare Other | Source: Home / Self Care | Attending: Internal Medicine | Admitting: Internal Medicine

## 2019-02-22 ENCOUNTER — Other Ambulatory Visit
Admission: RE | Admit: 2019-02-22 | Discharge: 2019-02-22 | Disposition: A | Discharge status: Home / Self Care | Payer: Medicare Other | Source: Ambulatory Visit | Attending: Internal Medicine | Admitting: Internal Medicine

## 2019-02-22 ENCOUNTER — Other Ambulatory Visit: Payer: 59

## 2019-02-22 DIAGNOSIS — E7800 Pure hypercholesterolemia, unspecified: Secondary | ICD-10-CM | POA: Diagnosis present

## 2019-02-22 DIAGNOSIS — I4892 Unspecified atrial flutter: Secondary | ICD-10-CM

## 2019-02-22 DIAGNOSIS — R001 Bradycardia, unspecified: Secondary | ICD-10-CM

## 2019-02-22 DIAGNOSIS — Z1159 Encounter for screening for other viral diseases: Secondary | ICD-10-CM | POA: Insufficient documentation

## 2019-02-22 DIAGNOSIS — R739 Hyperglycemia, unspecified: Secondary | ICD-10-CM | POA: Insufficient documentation

## 2019-02-22 DIAGNOSIS — M8949 Other hypertrophic osteoarthropathy, multiple sites: Secondary | ICD-10-CM | POA: Diagnosis not present

## 2019-02-22 DIAGNOSIS — Z01812 Encounter for preprocedural laboratory examination: Secondary | ICD-10-CM | POA: Insufficient documentation

## 2019-02-22 DIAGNOSIS — M1A00X0 Idiopathic chronic gout, unspecified site, without tophus (tophi): Secondary | ICD-10-CM | POA: Diagnosis not present

## 2019-02-22 DIAGNOSIS — M1A9XX0 Chronic gout, unspecified, without tophus (tophi): Secondary | ICD-10-CM

## 2019-02-22 LAB — URINALYSIS, ROUTINE W REFLEX MICROSCOPIC
Bilirubin Urine: NEGATIVE
Glucose, UA: NEGATIVE mg/dL
Hgb urine dipstick: NEGATIVE
Ketones, ur: NEGATIVE mg/dL
Leukocytes,Ua: NEGATIVE
Nitrite: NEGATIVE
Protein, ur: NEGATIVE mg/dL
Specific Gravity, Urine: 1.015 (ref 1.005–1.030)
pH: 6 (ref 5.0–8.0)

## 2019-02-22 LAB — CBC WITH DIFFERENTIAL/PLATELET
Abs Immature Granulocytes: 0.04 10*3/uL (ref 0.00–0.07)
Basophils Absolute: 0 10*3/uL (ref 0.0–0.1)
Basophils Relative: 1 %
Eosinophils Absolute: 0.1 10*3/uL (ref 0.0–0.5)
Eosinophils Relative: 2 %
HCT: 42.1 % (ref 39.0–52.0)
Hemoglobin: 14 g/dL (ref 13.0–17.0)
Immature Granulocytes: 1 %
Lymphocytes Relative: 30 %
Lymphs Abs: 1.7 10*3/uL (ref 0.7–4.0)
MCH: 31 pg (ref 26.0–34.0)
MCHC: 33.3 g/dL (ref 30.0–36.0)
MCV: 93.1 fL (ref 80.0–100.0)
Monocytes Absolute: 0.5 10*3/uL (ref 0.1–1.0)
Monocytes Relative: 9 %
Neutro Abs: 3.2 10*3/uL (ref 1.7–7.7)
Neutrophils Relative %: 57 %
Platelets: 150 10*3/uL (ref 150–400)
RBC: 4.52 MIL/uL (ref 4.22–5.81)
RDW: 12.5 % (ref 11.5–15.5)
WBC: 5.6 10*3/uL (ref 4.0–10.5)
nRBC: 0 % (ref 0.0–0.2)

## 2019-02-22 LAB — COMPREHENSIVE METABOLIC PANEL
ALT: 14 U/L (ref 0–44)
AST: 19 U/L (ref 15–41)
Albumin: 4.2 g/dL (ref 3.5–5.0)
Alkaline Phosphatase: 53 U/L (ref 38–126)
Anion gap: 9 (ref 5–15)
BUN: 19 mg/dL (ref 8–23)
CO2: 27 mmol/L (ref 22–32)
Calcium: 9.3 mg/dL (ref 8.9–10.3)
Chloride: 105 mmol/L (ref 98–111)
Creatinine, Ser: 0.97 mg/dL (ref 0.61–1.24)
GFR calc Af Amer: >60 mL/min (ref >60)
GFR calc non Af Amer: >60 mL/min (ref >60)
Glucose, Bld: 107 mg/dL — ABNORMAL HIGH (ref 70–99)
Potassium: 4.5 mmol/L (ref 3.5–5.1)
Sodium: 141 mmol/L (ref 135–145)
Total Bilirubin: 1.1 mg/dL (ref 0.3–1.2)
Total Protein: 7.7 g/dL (ref 6.5–8.1)

## 2019-02-22 LAB — LIPID PANEL
Cholesterol: 243 mg/dL — ABNORMAL HIGH (ref 0–200)
HDL: 43 mg/dL (ref >40)
LDL Cholesterol: 173 mg/dL — ABNORMAL HIGH (ref 0–99)
Total CHOL/HDL Ratio: 5.7 RATIO
Triglycerides: 133 mg/dL (ref <150)
VLDL: 27 mg/dL (ref 0–40)

## 2019-02-22 LAB — HEMOGLOBIN A1C
Hgb A1c MFr Bld: 5.5 % (ref 4.8–5.6)
Mean Plasma Glucose: 111.15 mg/dL

## 2019-02-22 LAB — URIC ACID: Uric Acid, Serum: 8.8 mg/dL — ABNORMAL HIGH (ref 3.7–8.6)

## 2019-02-22 LAB — TSH: TSH: 1.482 u[IU]/mL (ref 0.350–4.500)

## 2019-02-23 LAB — NOVEL CORONAVIRUS, NAA (HOSP ORDER, SEND-OUT TO REF LAB; TAT 18-24 HRS): SARS-CoV-2, NAA: NOT DETECTED

## 2019-02-24 ENCOUNTER — Other Ambulatory Visit: Payer: Self-pay | Admitting: Internal Medicine

## 2019-02-25 ENCOUNTER — Other Ambulatory Visit: Payer: Self-pay

## 2019-02-25 ENCOUNTER — Ambulatory Visit (HOSPITAL_COMMUNITY)
Admission: RE | Admit: 2019-02-25 | Discharge: 2019-02-26 | Disposition: A | Discharge status: Home / Self Care | Payer: Medicare Other | Attending: Internal Medicine | Admitting: Internal Medicine

## 2019-02-25 ENCOUNTER — Encounter (HOSPITAL_COMMUNITY): Payer: Self-pay

## 2019-02-25 ENCOUNTER — Ambulatory Visit (HOSPITAL_COMMUNITY)
Admission: RE | Disposition: A | Discharge status: Home / Self Care | Payer: Self-pay | Source: Home / Self Care | Attending: Internal Medicine

## 2019-02-25 ENCOUNTER — Telehealth: Payer: Self-pay | Admitting: Internal Medicine

## 2019-02-25 DIAGNOSIS — Z79899 Other long term (current) drug therapy: Secondary | ICD-10-CM | POA: Insufficient documentation

## 2019-02-25 DIAGNOSIS — I4892 Unspecified atrial flutter: Secondary | ICD-10-CM | POA: Insufficient documentation

## 2019-02-25 DIAGNOSIS — I442 Atrioventricular block, complete: Secondary | ICD-10-CM | POA: Diagnosis not present

## 2019-02-25 DIAGNOSIS — Z886 Allergy status to analgesic agent status: Secondary | ICD-10-CM | POA: Insufficient documentation

## 2019-02-25 DIAGNOSIS — M1990 Unspecified osteoarthritis, unspecified site: Secondary | ICD-10-CM | POA: Diagnosis not present

## 2019-02-25 DIAGNOSIS — Z885 Allergy status to narcotic agent status: Secondary | ICD-10-CM | POA: Insufficient documentation

## 2019-02-25 DIAGNOSIS — I480 Paroxysmal atrial fibrillation: Secondary | ICD-10-CM | POA: Diagnosis not present

## 2019-02-25 DIAGNOSIS — Z87891 Personal history of nicotine dependence: Secondary | ICD-10-CM | POA: Insufficient documentation

## 2019-02-25 DIAGNOSIS — R5383 Other fatigue: Secondary | ICD-10-CM | POA: Diagnosis not present

## 2019-02-25 DIAGNOSIS — I495 Sick sinus syndrome: Secondary | ICD-10-CM | POA: Diagnosis present

## 2019-02-25 DIAGNOSIS — N529 Male erectile dysfunction, unspecified: Secondary | ICD-10-CM | POA: Diagnosis not present

## 2019-02-25 DIAGNOSIS — Z888 Allergy status to other drugs, medicaments and biological substances status: Secondary | ICD-10-CM | POA: Insufficient documentation

## 2019-02-25 DIAGNOSIS — R001 Bradycardia, unspecified: Secondary | ICD-10-CM

## 2019-02-25 DIAGNOSIS — Z959 Presence of cardiac and vascular implant and graft, unspecified: Secondary | ICD-10-CM

## 2019-02-25 HISTORY — PX: PACEMAKER IMPLANT: EP1218

## 2019-02-25 LAB — SURGICAL PCR SCREEN
MRSA, PCR: NEGATIVE
Staphylococcus aureus: NEGATIVE

## 2019-02-25 SURGERY — PACEMAKER IMPLANT

## 2019-02-25 MED ORDER — TAMSULOSIN HCL 0.4 MG PO CAPS: 0.4000 mg | ORAL_CAPSULE | Freq: Every day | ORAL | Status: DC

## 2019-02-25 MED ORDER — CEFAZOLIN SODIUM-DEXTROSE 2-4 GM/100ML-% IV SOLN
INTRAVENOUS | Status: AC
  Filled 2019-02-25: qty 100

## 2019-02-25 MED ORDER — FENTANYL CITRATE (PF) 100 MCG/2ML IJ SOLN
INTRAMUSCULAR | Status: AC
  Filled 2019-02-25: qty 2

## 2019-02-25 MED ORDER — B COMPLEX-C PO TABS
1.0000 | ORAL_TABLET | Freq: Every day | ORAL | Status: DC
  Filled 2019-02-25 (×2): qty 1

## 2019-02-25 MED ORDER — ACETAMINOPHEN 325 MG PO TABS
325.0000 mg | ORAL_TABLET | ORAL | Status: DC | PRN
  Administered 2019-02-25 – 2019-02-26 (×3): 650 mg via ORAL
  Filled 2019-02-25 (×3): qty 2

## 2019-02-25 MED ORDER — ONDANSETRON HCL 4 MG/2ML IJ SOLN: 4.0000 mg | Freq: Four times a day (QID) | INTRAMUSCULAR | Status: DC | PRN

## 2019-02-25 MED ORDER — SODIUM CHLORIDE 0.9 % IV SOLN
80.0000 mg | INTRAVENOUS | Status: AC
  Administered 2019-02-25: 80 mg

## 2019-02-25 MED ORDER — FINASTERIDE 5 MG PO TABS: 5.0000 mg | ORAL_TABLET | Freq: Every evening | ORAL | Status: DC

## 2019-02-25 MED ORDER — ADULT MULTIVITAMIN W/MINERALS CH: 1.0000 | ORAL_TABLET | Freq: Every day | ORAL | Status: DC

## 2019-02-25 MED ORDER — LIDOCAINE HCL 1 % IJ SOLN
INTRAMUSCULAR | Status: AC
  Filled 2019-02-25: qty 60

## 2019-02-25 MED ORDER — HEPARIN (PORCINE) IN NACL 1000-0.9 UT/500ML-% IV SOLN
INTRAVENOUS | Status: AC
  Filled 2019-02-25: qty 500

## 2019-02-25 MED ORDER — FENTANYL CITRATE (PF) 100 MCG/2ML IJ SOLN
INTRAMUSCULAR | Status: DC | PRN
  Administered 2019-02-25 (×3): 12.5 ug via INTRAVENOUS
  Administered 2019-02-25: 25 ug via INTRAVENOUS

## 2019-02-25 MED ORDER — SODIUM CHLORIDE 0.9 % IV SOLN
INTRAVENOUS | Status: DC
  Administered 2019-02-25: 12:00:00 via INTRAVENOUS

## 2019-02-25 MED ORDER — SODIUM CHLORIDE 0.9 % IV SOLN: INTRAVENOUS | Status: DC

## 2019-02-25 MED ORDER — MIDAZOLAM HCL 5 MG/5ML IJ SOLN
INTRAMUSCULAR | Status: AC
  Filled 2019-02-25: qty 5

## 2019-02-25 MED ORDER — SOTALOL HCL 120 MG PO TABS
120.0000 mg | ORAL_TABLET | Freq: Two times a day (BID) | ORAL | Status: DC
  Administered 2019-02-25 – 2019-02-26 (×2): 120 mg via ORAL
  Filled 2019-02-25 (×3): qty 1

## 2019-02-25 MED ORDER — SODIUM CHLORIDE 0.9 % IV SOLN
INTRAVENOUS | Status: AC
  Filled 2019-02-25: qty 2

## 2019-02-25 MED ORDER — MULTI-MINERALS PO TABS: 1.0000 | ORAL_TABLET | Freq: Every day | ORAL | Status: DC

## 2019-02-25 MED ORDER — MUPIROCIN 2 % EX OINT
TOPICAL_OINTMENT | CUTANEOUS | Status: AC
  Administered 2019-02-25: 12:00:00
  Filled 2019-02-25: qty 22

## 2019-02-25 MED ORDER — CHLORHEXIDINE GLUCONATE 4 % EX LIQD
60.0000 mL | Freq: Once | CUTANEOUS | Status: DC
  Filled 2019-02-25: qty 60

## 2019-02-25 MED ORDER — HEPARIN (PORCINE) IN NACL 1000-0.9 UT/500ML-% IV SOLN
INTRAVENOUS | Status: DC | PRN
  Administered 2019-02-25: 500 mL

## 2019-02-25 MED ORDER — LIDOCAINE HCL (PF) 1 % IJ SOLN
INTRAMUSCULAR | Status: DC | PRN
  Administered 2019-02-25: 50 mL

## 2019-02-25 MED ORDER — B COMPLEX PO TABS: 1.0000 | ORAL_TABLET | Freq: Every day | ORAL | Status: DC

## 2019-02-25 MED ORDER — DIPHENHYDRAMINE HCL 25 MG PO TABS: 25.0000 mg | ORAL_TABLET | Freq: Every evening | ORAL | Status: DC | PRN

## 2019-02-25 MED ORDER — MIDAZOLAM HCL 5 MG/5ML IJ SOLN
INTRAMUSCULAR | Status: DC | PRN
  Administered 2019-02-25 (×4): 1 mg via INTRAVENOUS

## 2019-02-25 MED ORDER — CYCLOBENZAPRINE HCL 10 MG PO TABS: 10.0000 mg | ORAL_TABLET | Freq: Three times a day (TID) | ORAL | Status: DC | PRN

## 2019-02-25 MED ORDER — CEFAZOLIN SODIUM-DEXTROSE 2-4 GM/100ML-% IV SOLN
2.0000 g | INTRAVENOUS | Status: AC
  Administered 2019-02-25: 2 g via INTRAVENOUS

## 2019-02-25 MED ORDER — CEFAZOLIN SODIUM-DEXTROSE 1-4 GM/50ML-% IV SOLN
1.0000 g | Freq: Four times a day (QID) | INTRAVENOUS | Status: AC
  Administered 2019-02-25 – 2019-02-26 (×3): 1 g via INTRAVENOUS
  Filled 2019-02-25 (×3): qty 50

## 2019-02-25 MED ORDER — B COMPLEX-C PO TABS: 1.0000 | ORAL_TABLET | Freq: Every day | ORAL | Status: DC

## 2019-02-25 SURGICAL SUPPLY — 9 items
CABLE SURGICAL S-101-97-12 (CABLE) ×3 IMPLANT
HEMOSTAT SURGICEL 2X4 FIBR (HEMOSTASIS) ×3 IMPLANT
IPG PACE AZUR XT DR MRI W1DR01 (Pacemaker) ×1 IMPLANT
LEAD CAPSURE NOVUS 5076-52CM (Lead) ×3 IMPLANT
LEAD CAPSURE NOVUS 5076-58CM (Lead) ×3 IMPLANT
PACE AZURE XT DR MRI W1DR01 (Pacemaker) ×3 IMPLANT
PAD PRO RADIOLUCENT 2001M-C (PAD) ×3 IMPLANT
SHEATH CLASSIC 7F (SHEATH) ×6 IMPLANT
TRAY PACEMAKER INSERTION (PACKS) ×3 IMPLANT

## 2019-02-25 NOTE — Telephone Encounter (Signed)
Patient calling in regards to procedure today States he went to [facility] but they told him it was in [place] Patient is on his way but wanted to make sure this information was correct and that he will still be able to have it done Please call to discuss

## 2019-02-25 NOTE — Interval H&P Note (Signed)
History and Physical Interval Note:  02/25/2019 1:44 PM  Kerry Rowland  has presented today for surgery, with the diagnosis of Bradycardia.  The various methods of treatment have been discussed with the patient and family. After consideration of risks, benefits and other options for treatment, the patient has consented to  Procedure(s): PACEMAKER IMPLANT (N/A) as a surgical intervention.  The patient's history has been reviewed, patient examined, no change in status, stable for surgery.  I have reviewed the patient's chart and labs.  Questions were answered to the patient's satisfaction.     Kerry Rowland  Well developed and nourished in no acute distress HENT normal Neck supple with JVP-  flat   Clear Regular rate and rhythm, 2/6 early murmur  Abd-soft with active BS No Clubbing cyanosis edema Skin-warm and dry A & Oriented  Grossly normal sensory and motor function  ECG sinus 54 with 1 AVB    Assessment and plan  Atrial flutter/fibrillation-by history  Sinus bradycardia/first-degree AV block  AV block 2: 1 mostly type I  LV function low normal 50-55%   Pt has heart block and bradycardia with near normal LV function; also hx of atrial arrhythmias for which he does not take anticoagulation   The benefits and risks were reviewed including but not limited to death,  perforation, infection, lead dislodgement and device malfunction.  The patient understands agrees and is willing to proceed.  We also discussed the role of anticoagulation in atrial fib as independent of the maintaining of sinus rhythm-- we will use the device for surveillance and he is aware of the issue regarding anticoagulation

## 2019-02-25 NOTE — Discharge Summary (Signed)
ELECTROPHYSIOLOGY PROCEDURE DISCHARGE SUMMARY    Patient ID: Kerry Rowland,  MRN: [id number], DOB/AGE: [birth year] 83 y.o.  Admit date: 02/25/2019 Discharge date: 02/26/2019  Primary Care Physician: Adin Hector, MD  Primary Cardiologist: Dr. Rockey Situ Electrophysiologist: Dr. Caryl Comes  Primary Discharge Diagnosis:  Symptomatic bradycardia status post pacemaker implantation this admission  Secondary Discharge Diagnosis:  1. Very remote h/o PAFib/flutter     No longer on a/c  Allergies  Allergen Reactions  . Morphine And Related Other (See Comments)    Chills, felt cold   . Nsaids Other (See Comments)    intestinal bleeding  . Other     Nightshade plants - inflammation pain  . Phenergan [Promethazine Hcl]     Uncontrolled muscle movement      Procedures This Admission:  1.  Implantation of a MDT dual chamber PPM on 02/25/2019 by Dr Caryl Comes.  The patient received a Medtronic MRI compatible  pulse generator serial number [id number], Medtronic MRI compatible 5076 ventricular lead serial [id number] and a  Medtronic MRI compatible 5076 atrial lead serial number [id number]  There were no immediate post procedure complications. 2.  CXR on 02/26/2019 demonstrated no pneumothorax status post device implantation.   Brief HPI: Kerry Rowland is a 83 y.o. male was referred to electrophysiology in the outpatient setting for consideration of PPM implantation.  Past medical history includes above.  Risks, benefits, and alternatives to PPM implantation were reviewed with the patient who wished to proceed.   Hospital Course:  The patient was admitted and underwent implantation of a PPM with details as outlined above.  He was monitored on telemetry overnight which demonstrated AV pacing.  Left chest was without hematoma or ecchymosis.  The device was interrogated and found to be functioning normally.  CXR was obtained and demonstrated no pneumothorax status post device implantation.  Wound  care, arm mobility, and restrictions were reviewed with the patient.  The patient feels well this morning, no CP or SOB, he was examined by Dr. Caryl Comes and considered stable for discharge to home.    Physical Exam: Vitals:   02/25/19 1722 02/25/19 1732 02/25/19 2202 02/26/19 0651  BP: 133/72 (!) 143/78 118/64 (!) 141/84  Pulse: (!) 6  60 60  Resp: 14  17 16   Temp: 97.7 F (36.5 C)  97.9 F (36.6 C) 97.8 F (36.6 C)  TempSrc: Oral  Oral Oral  SpO2: 100%  95% 99%  Weight:    83.5 kg  Height:        GEN- The patient is well appearing, alert and oriented x 3 today.   HEENT: normocephalic, atraumatic; sclera clear, conjunctiva pink; hearing intact; oropharynx clear; neck supple, no JVP Lungs- CTA b/l, normal work of breathing.  No wheezes, rales, rhonchi Heart- RRR, no murmurs, rubs or gallops, PMI not laterally displaced GI- soft, non-tender, non-distended Extremities- no clubbing, cyanosis, or edema MS- no significant deformity or atrophy Skin- warm and dry, no rash or lesion, left chest without hematoma/ecchymosis Psych- euthymic mood, full affect Neuro- no gross deficits   Labs:   Lab Results  Component Value Date   WBC 5.6 02/22/2019   HGB 14.0 02/22/2019   HCT 42.1 02/22/2019   MCV 93.1 02/22/2019   PLT 150 02/22/2019    Recent Labs  Lab 02/22/19 1001  NA 141  K 4.5  CL 105  CO2 27  BUN 19  CREATININE 0.97  CALCIUM 9.3  PROT 7.7  BILITOT 1.1  ALKPHOS 53  ALT 14  AST 19  GLUCOSE 107*    Discharge Medications:  Allergies as of 02/26/2019      Reactions   Morphine And Related Other (See Comments)   Chills, felt cold    Nsaids Other (See Comments)   intestinal bleeding   Other    Nightshade plants - inflammation pain   Phenergan [promethazine Hcl]    Uncontrolled muscle movement       Medication List    TAKE these medications   b complex vitamins tablet Take 1 tablet by mouth daily.   B-12 PO Take 1 tablet by mouth daily.   BLACK  PEPPER-TURMERIC PO Take 1 Dose by mouth daily.   cetirizine 10 MG tablet Commonly known as:  ZYRTEC Take 10 mg by mouth daily as needed for allergies.   CINNAMON PO Take 1 Dose by mouth daily.   cyclobenzaprine 10 MG tablet Commonly known as:  FLEXERIL Take 10 mg by mouth 3 (three) times daily as needed for muscle spasms.   diphenhydrAMINE 25 MG tablet Commonly known as:  BENADRYL Take 25 mg by mouth at bedtime as needed for sleep.   finasteride 5 MG tablet Commonly known as:  PROSCAR Take 1 tablet (5 mg total) by mouth daily. What changed:  when to take this   MAGNESIUM PO Take 1 tablet by mouth daily.   Multi-Minerals Tabs Take 1 tablet by mouth daily.   Multi-Vitamins Tabs Take 1 tablet by mouth daily.   OVER THE COUNTER MEDICATION Take 1 Dose by mouth daily. Lion's mane otc supplement   OVER THE COUNTER MEDICATION Take 1 Dose by mouth daily. reishi otc supplement   PANTOTHENIC ACID PO Take 1 tablet by mouth daily.   propranolol 20 MG tablet Commonly known as:  INDERAL Take 1 tablet (20 mg total) by mouth 3 (three) times daily as needed. For extra beats. What changed:    reasons to take this  additional instructions   SAM-E COMPLETE PO Take 1 tablet by mouth daily.   sotalol 120 MG tablet Commonly known as:  BETAPACE Take 120 mg by mouth 2 (two) times daily.   tamsulosin 0.4 MG Caps capsule Commonly known as:  FLOMAX TAKE 1 CAPSULE ONCE AS DIRECTED What changed:    how much to take  how to take this  when to take this  additional instructions   VITAMIN B-2 PO Take 1 tablet by mouth daily.   VITAMIN C PO Take 1 tablet by mouth daily.   VITAMIN D PO Take 1 tablet by mouth daily.   VITAMIN E PO Take 1 capsule by mouth daily.   ZINC PO Take 1 tablet by mouth daily.       Disposition:  Home  Discharge Instructions    Diet - low sodium heart healthy   Complete by:  As directed    Increase activity slowly   Complete by:  As  directed      Follow-up Information    [facility] Follow up.   Specialty:  Cardiology Why:  03/07/2019 @ 11:00AM, wound check visit Contact information: [address] [place]       Deboraha Sprang, MD Follow up.   Specialty:  Cardiology Why:  you will be called to schedule a routine 3 month visit with Dr.Klein Contact information: [place] [address] [phone]           Duration  of Discharge Encounter: Greater than 30 minutes including physician time.  Venetia Night, PA-C 02/26/2019 8:17 AM

## 2019-02-25 NOTE — Interval H&P Note (Signed)
History and Physical Interval Note:  02/25/2019 12:57 PM  Kerry Rowland  has presented today for surgery, with the diagnosis of Bradycardia.  The various methods of treatment have been discussed with the patient and family. After consideration of risks, benefits and other options for treatment, the patient has consented to  Procedure(s): PACEMAKER IMPLANT (N/A) as a surgical intervention.  The patient's history has been reviewed, patient examined, no change in status, stable for surgery.  I have reviewed the patient's chart and labs.  Questions were answered to the patient's satisfaction.     [name]

## 2019-02-25 NOTE — Telephone Encounter (Signed)
Returned the pt call. lmom if assistance is still needed. Pt pcr screening is pending, on review pt procedure @ [facility] has not been been cancelled.

## 2019-02-25 NOTE — Progress Notes (Signed)
Orthopedic Tech Progress Note Patient Details:  Kerry Rowland 1936/09/23 [id number] Patient has arm sling Patient ID: Kerry Rowland, male   DOB: 1936/01/30, 83 y.o.   MRN: [id number]   [name] 02/25/2019, 4:39 PM

## 2019-02-26 ENCOUNTER — Encounter (HOSPITAL_COMMUNITY): Payer: Self-pay | Admitting: Internal Medicine

## 2019-02-26 ENCOUNTER — Ambulatory Visit (HOSPITAL_COMMUNITY): Payer: Medicare Other

## 2019-02-26 DIAGNOSIS — I442 Atrioventricular block, complete: Secondary | ICD-10-CM | POA: Diagnosis not present

## 2019-02-26 DIAGNOSIS — I480 Paroxysmal atrial fibrillation: Secondary | ICD-10-CM | POA: Diagnosis not present

## 2019-02-26 DIAGNOSIS — I495 Sick sinus syndrome: Secondary | ICD-10-CM | POA: Diagnosis not present

## 2019-02-26 DIAGNOSIS — I4892 Unspecified atrial flutter: Secondary | ICD-10-CM | POA: Diagnosis not present

## 2019-02-26 MED FILL — Lidocaine HCl Local Preservative Free (PF) Inj 1%: INTRAMUSCULAR | Qty: 60 | Status: AC

## 2019-02-26 NOTE — Discharge Instructions (Signed)
° ° °  Supplemental Discharge Instructions for  Pacemaker/Defibrillator Patients  Activity No heavy lifting or vigorous activity with your left/right arm for 6 to 8 weeks.  Do not raise your left/right arm above your head for one week.  Gradually raise your affected arm as drawn below.              03/01/2019                   03/02/2019                  03/03/2019                 03/04/2019 __   NO DRIVING for 1 week    ; you may begin driving on  03/31/2830   .  WOUND CARE - Keep the wound area clean and dry.  Do not get this area wet for 24 hours. No showers for24 hours; you may shower on  02/26/2019 evening   . - The tape/steri-strips on your wound will fall off; do not pull them off.  No bandage is needed on the site.  DO  NOT apply any creams, oils, or ointments to the wound area. - If you notice any drainage or discharge from the wound, any swelling or bruising at the site, or you develop a fever > 101? F after you are discharged home, call the office at once.  Special Instructions - You are still able to use cellular telephones; use the ear opposite the side where you have your pacemaker/defibrillator.  Avoid carrying your cellular phone near your device. - When traveling through airports, show security personnel your identification card to avoid being screened in the metal detectors.  Ask the security personnel to use the hand wand. - Avoid arc welding equipment, MRI testing (magnetic resonance imaging), TENS units (transcutaneous nerve stimulators).  Call the office for questions about other devices. - Avoid electrical appliances that are in poor condition or are not properly grounded. - Microwave ovens are safe to be near or to operate.

## 2019-02-28 DIAGNOSIS — R7309 Other abnormal glucose: Secondary | ICD-10-CM | POA: Insufficient documentation

## 2019-03-06 ENCOUNTER — Telehealth: Payer: Self-pay

## 2019-03-06 NOTE — Telephone Encounter (Signed)
Pt answered No to all the Covid-19 questions. I advised the pt to wear a facemask if he has one. I also told the pt if he do not need help not to bring anyone with him to the office due to we are limiting the amount of people in and out the office. The pt verbalized understanding.

## 2019-03-06 NOTE — Telephone Encounter (Signed)
Follow up  ° ° °Pt is returning call  ° ° °Please call back  °

## 2019-03-06 NOTE — Telephone Encounter (Signed)
    COVID-19 Pre-Screening Questions:  . In the past 7 to 10 days have you had a cough,  shortness of breath, headache, congestion, fever (100 or greater) body aches, chills, sore throat, or sudden loss of taste or sense of smell? . Have you been around anyone with known Covid 19. . Have you been around anyone who is awaiting Covid 19 test results in the past 7 to 10 days? . Have you been around anyone who has been exposed to Covid 19, or has mentioned symptoms of Covid 19 within the past 7 to 10 days?  If you have any concerns/questions about symptoms patients report during screening (either on the phone or at threshold). Contact the provider seeing the patient or DOD for further guidance.  If neither are available contact a member of the leadership team.           LMOVM for pt to call me back.

## 2019-03-07 ENCOUNTER — Ambulatory Visit (INDEPENDENT_AMBULATORY_CARE_PROVIDER_SITE_OTHER): Payer: Medicare Other | Admitting: *Deleted

## 2019-03-07 ENCOUNTER — Other Ambulatory Visit: Payer: Self-pay

## 2019-03-07 DIAGNOSIS — I495 Sick sinus syndrome: Secondary | ICD-10-CM

## 2019-03-07 LAB — CUP PACEART INCLINIC DEVICE CHECK
Battery Remaining Longevity: 166 mo
Battery Voltage: 3.22 V
Brady Statistic AP VP Percent: 1.65 %
Brady Statistic AP VS Percent: 4.35 %
Brady Statistic AS VP Percent: 29.03 %
Brady Statistic AS VS Percent: 64.97 %
Brady Statistic RA Percent Paced: 5.89 %
Brady Statistic RV Percent Paced: 30.67 %
Date Time Interrogation Session: 20200611132201
Implantable Lead Implant Date: 20200601
Implantable Lead Implant Date: 20200601
Implantable Lead Location: 753859
Implantable Lead Location: 753860
Implantable Lead Model: 5076
Implantable Lead Model: 5076
Implantable Pulse Generator Implant Date: 20200601
Lead Channel Impedance Value: 304 Ohm
Lead Channel Impedance Value: 418 Ohm
Lead Channel Impedance Value: 532 Ohm
Lead Channel Impedance Value: 589 Ohm
Lead Channel Pacing Threshold Amplitude: 0.75 V
Lead Channel Pacing Threshold Amplitude: 0.75 V
Lead Channel Pacing Threshold Pulse Width: 0.4 ms
Lead Channel Pacing Threshold Pulse Width: 0.4 ms
Lead Channel Sensing Intrinsic Amplitude: 1.625 mV
Lead Channel Sensing Intrinsic Amplitude: 10.25 mV
Lead Channel Sensing Intrinsic Amplitude: 10.75 mV
Lead Channel Sensing Intrinsic Amplitude: 2.625 mV
Lead Channel Setting Pacing Amplitude: 3.5 V
Lead Channel Setting Pacing Amplitude: 3.5 V
Lead Channel Setting Pacing Pulse Width: 0.4 ms
Lead Channel Setting Sensing Sensitivity: 2 mV

## 2019-03-07 NOTE — Telephone Encounter (Signed)
Spoke with pt. Asymptomatic, he is not sure if he was awakened by the low heart rates. HR is per a [employer] smart watch. He is coming to device clinic for check today.    Kerry Rowland [address]" [place], PA-C 03/07/2019 9:26 AM

## 2019-03-07 NOTE — Progress Notes (Signed)
Wound check appointment. Dermabond removed. Wound without redness or edema. Incision edges approximated, wound healing well. Normal device function. Thresholds, sensing, and impedances consistent with implant measurements. Device programmed at 3.5V for extra safety margin until 3 month visit. Histogram distribution appropriate for patient and level of activity. No mode switches or high ventricular rates noted. AT/AF burden alert turned on per protocol. Patient educated about wound care, arm mobility, lifting restrictions. ROV in 3 months with SK.

## 2019-03-21 ENCOUNTER — Telehealth: Payer: Self-pay

## 2019-03-21 NOTE — Telephone Encounter (Signed)
Transmission reviewed. Normal device function. Presenting rhythm As/Vs @ 60bpm. No episodes. Lead trends stable. PVC singles 0.1/hour and PVC runs <0.1/hour per counters. Base rate is programmed at 50bpm, rate response is not enabled (mode AAI<->DDD). Routed to Dr. Caryl Comes for review and recommendations.  Pt made aware of normal device function via MyChart message.

## 2019-03-21 NOTE — Telephone Encounter (Signed)
I let the pt know that Dr. Caryl Comes needed a manual transmission with his home monitor. The pt agreed to send one. I told him once I see it I will let the nurse know so she can take a look at it. Transmission received 03-21-2019

## 2019-03-27 NOTE — Telephone Encounter (Signed)
He is clearly HR blunted,  If he has symptoms of such we can try and increase his HR a little bit  [employer] SK

## 2019-04-02 ENCOUNTER — Telehealth: Payer: Self-pay | Admitting: *Deleted

## 2019-04-02 NOTE — Telephone Encounter (Signed)

## 2019-04-03 ENCOUNTER — Ambulatory Visit (INDEPENDENT_AMBULATORY_CARE_PROVIDER_SITE_OTHER)
Admission: RE | Admit: 2019-04-03 | Discharge: 2019-04-03 | Disposition: A | Discharge status: Home / Self Care | Payer: Self-pay | Source: Ambulatory Visit | Attending: Cardiovascular Disease | Admitting: Cardiovascular Disease

## 2019-04-03 ENCOUNTER — Other Ambulatory Visit: Payer: Self-pay

## 2019-04-03 DIAGNOSIS — Z8249 Family history of ischemic heart disease and other diseases of the circulatory system: Secondary | ICD-10-CM

## 2019-04-03 DIAGNOSIS — I4892 Unspecified atrial flutter: Secondary | ICD-10-CM

## 2019-04-03 NOTE — Telephone Encounter (Signed)
LMOVM requesting call back to DC. Gave direct number for return call. Will ask if pt symptomatic and offer DC appointment for reprogramming.

## 2019-04-03 NOTE — Telephone Encounter (Signed)
See phone note from 03/21/19.

## 2019-04-03 NOTE — Telephone Encounter (Signed)
Spoke with patient. Addressed lifting restriction questions (see MyChart message from 04/03/19), advised allowed to lift >10lb beginning on 04/08/19. Pt verbalizes understanding.  Also discussed blunted HR histograms. Reassured that device function is WNL, but rate response is a feature that could help him feel better with activity/exercise. Pt reports he has been minimally active in recent weeks due to multiple factors, denies activity limitation due to ShOB or fatigue. Typically only feels winded when walking up hills. Pt declines an appointment with DC for reprogramming, wishes to wait until OV with Dr. Caryl Comes in 05/2019. Attempted to schedule OV, but schedule is not open yet. Pt is aware to call the [place] office in late July or August to schedule this visit. He denies additional questions or concerns at this time and thanked me for my call.

## 2019-04-15 ENCOUNTER — Telehealth: Payer: Self-pay | Admitting: *Deleted

## 2019-04-15 DIAGNOSIS — E782 Mixed hyperlipidemia: Secondary | ICD-10-CM

## 2019-04-15 NOTE — Telephone Encounter (Signed)
No answer. Left message to call back.   

## 2019-04-15 NOTE — Telephone Encounter (Signed)
-----   Message from Minna Merritts, MD sent at 04/14/2019  8:56 AM EDT ----- CT coronary calcium score Coronary calcium score of 752.  Also there is plaque in the aorta This was 62nd percentile for age and sex matched control. --- Score is elevated, cholesterol greater than 200 Would recommend starting Crestor 10 mg daily with recheck of cholesterol in 3 months time -If total cholesterol greater than 150 and LDL greater than 70 may need to increase the dose of Crestor or add Zetia

## 2019-04-16 MED ORDER — ROSUVASTATIN CALCIUM 10 MG PO TABS: 10.0000 mg | ORAL_TABLET | Freq: Every day | ORAL | 3 refills | Status: DC

## 2019-04-16 NOTE — Telephone Encounter (Signed)
Spoke with the pt. Pt made aware of his Cor calcium CT score and Dr.Gollan's recommendation. Pt is agreeable with starting Crestor 10mg  daily. Rx sent to the pt pharmacy. Lab orders in Epic for 3 mo fasting lipid and lft. Pt will have labs at the [facility] medical mall. Pt verbalized understanding and voiced appreciation for the call.

## 2019-04-16 NOTE — Telephone Encounter (Signed)
Returned the pt call. [email or web address]. 

## 2019-04-16 NOTE — Telephone Encounter (Signed)
Patient returning call.

## 2019-04-23 NOTE — Progress Notes (Signed)
04/24/2019 4:15 PM   Kerry Rowland 12/06/1935 [id number]  Referring provider: Adin Hector, MD [place] [place],  [place] [address]  Chief Complaint  Patient presents with  . Benign Prostatic Hypertrophy    HPI: Patient is a 83 year old Caucasian male with erectile dysfunction, testosterone deficiency and BPH with LUTS who presents today for a one year follow up.    BPH WITH LUTS His IPSS score today is 1, which is mild lower urinary tract symptomatology. He is mostly satisfied with his quality life due to his urinary symptoms.  His PVR is 75 mL.  He denies any dysuria, hematuria or suprapubic pain.  His previous I PSS score was 6/1.  He currently taking tamsulosin 0.4 mg daily and finasteride 5 mg daily.    He also denies any recent fevers, chills, nausea or vomiting.  He does not have a family history of PCa.  He feels that his peripheral neuropathy is worsening.  He has it in his feet and is concerned that his hands are starting to have signs of neuropathy.  He is also concerned as he states he cannot feel himself void.  He feels the sensation to urinate and feels that he is emptying his bladder.   He only knows when the urine stream has started when he hears the sound of the urine hitting the toilet and only knows when the urine stream has completed when he no longer hears the urine hitting the toilet.    He denies any incontinence of stool or urine.  He is experiencing some post void dribbling.    IPSS    Row Name 04/24/19 1500         International Prostate Symptom Score   How often have you had the sensation of not emptying your bladder?  Not at All     How often have you had to urinate less than every two hours?  Less than 1 in 5 times     How often have you found you stopped and started again several times when you urinated?  Not at All     How often have you found it difficult to postpone urination?  Not at All     How often have  you had a weak urinary stream?  Not at All     How often have you had to strain to start urination?  Not at All     How many times did you typically get up at night to urinate?  None     Total IPSS Score  1       Quality of Life due to urinary symptoms   If you were to spend the rest of your life with your urinary condition just the way it is now how would you feel about that?  Mostly Satisfied        Score:  1-7 Mild 8-19 Moderate 20-35 Severe    PMH: Past Medical History:  Diagnosis Date  . Acute idiopathic gout of right foot   . Colon polyps   . Constipation   . Erectile dysfunction   . Generalized abdominal mass   . Hyperglycemia   . Osteoarthritis   . Paroxysmal atrial flutter ([place])   . Raynaud's phenomenon   . Skin cancer     Surgical History: Past Surgical History:  Procedure Laterality Date  . APPENDECTOMY    . CATARACT EXTRACTION    . COLONOSCOPY WITH PROPOFOL  N/A 05/22/2015   Procedure: COLONOSCOPY WITH PROPOFOL;  Surgeon: Manya Silvas, MD;  Location: [facility] ENDOSCOPY;  Service: Endoscopy;  Laterality: N/A;  . PACEMAKER IMPLANT N/A 02/25/2019   Procedure: PACEMAKER IMPLANT;  Surgeon: Deboraha Sprang, MD;  Location: [place] [facility];  Service: Cardiovascular;  Laterality: N/A;  . skin grafts    . TONSILLECTOMY      Home Medications:  Allergies as of 04/24/2019      Reactions   Morphine And Related Other (See Comments)   Chills, felt cold    Nsaids Other (See Comments)   intestinal bleeding   Other    Nightshade plants - inflammation pain   Phenergan [promethazine Hcl]    Uncontrolled muscle movement       Medication List       Accurate as of April 24, 2019  4:15 PM. If you have any questions, ask your nurse or doctor.        b complex vitamins tablet Take 1 tablet by mouth daily.   B-12 PO Take 1 tablet by mouth daily.   BLACK PEPPER-TURMERIC PO Take 1 Dose by mouth daily.   cetirizine 10 MG tablet Commonly known as: ZYRTEC Take 10  mg by mouth daily as needed for allergies.   CINNAMON PO Take 1 Dose by mouth daily.   cyclobenzaprine 10 MG tablet Commonly known as: FLEXERIL Take 10 mg by mouth 3 (three) times daily as needed for muscle spasms.   diphenhydrAMINE 25 MG tablet Commonly known as: BENADRYL Take 25 mg by mouth at bedtime as needed for sleep.   finasteride 5 MG tablet Commonly known as: PROSCAR Take 1 tablet (5 mg total) by mouth daily. What changed: when to take this   MAGNESIUM PO Take 1 tablet by mouth daily.   Multi-Minerals Tabs Take 1 tablet by mouth daily.   Multi-Vitamins Tabs Take 1 tablet by mouth daily.   OVER THE COUNTER MEDICATION Take 1 Dose by mouth daily. Lion's mane otc supplement   OVER THE COUNTER MEDICATION Take 1 Dose by mouth daily. reishi otc supplement   PANTOTHENIC ACID PO Take 1 tablet by mouth daily.   propranolol 20 MG tablet Commonly known as: INDERAL Take 1 tablet (20 mg total) by mouth 3 (three) times daily as needed. For extra beats. What changed:   reasons to take this  additional instructions   rosuvastatin 10 MG tablet Commonly known as: CRESTOR Take 1 tablet (10 mg total) by mouth daily.   SAM-E COMPLETE PO Take 1 tablet by mouth daily.   sotalol 120 MG tablet Commonly known as: BETAPACE Take 120 mg by mouth 2 (two) times daily.   tamsulosin 0.4 MG Caps capsule Commonly known as: FLOMAX TAKE 1 CAPSULE ONCE AS DIRECTED What changed:   how much to take  how to take this  when to take this  additional instructions   VITAMIN B-2 PO Take 1 tablet by mouth daily.   VITAMIN C PO Take 1 tablet by mouth daily.   VITAMIN D PO Take 1 tablet by mouth daily.   VITAMIN E PO Take 1 capsule by mouth daily.   ZINC PO Take 1 tablet by mouth daily.       Allergies:  Allergies  Allergen Reactions  . Morphine And Related Other (See Comments)    Chills, felt cold   . Nsaids Other (See Comments)    intestinal bleeding  . Other      Nightshade plants - inflammation pain  .  Phenergan [Promethazine Hcl]     Uncontrolled muscle movement     Family History: Family History  Problem Relation Age of Onset  . Kidney disease Neg Hx   . Prostate cancer Neg Hx   . Kidney cancer Neg Hx   . Bladder Cancer Neg Hx     Social History:  reports that he has quit smoking. His smoking use included cigarettes. He has never used smokeless tobacco. He reports that he does not drink alcohol or use drugs.  ROS: UROLOGY Frequent Urination?: No Hard to postpone urination?: No Burning/pain with urination?: No Get up at night to urinate?: No Leakage of urine?: No Urine stream starts and stops?: No Trouble starting stream?: No Do you have to strain to urinate?: No Blood in urine?: No Urinary tract infection?: No Sexually transmitted disease?: No Injury to kidneys or bladder?: No Painful intercourse?: No Weak stream?: No Erection problems?: Yes Penile pain?: No  Gastrointestinal Nausea?: No Vomiting?: No Indigestion/heartburn?: No Diarrhea?: No Constipation?: No  Constitutional Fever: No Night sweats?: No Weight loss?: No Fatigue?: No  Skin Skin rash/lesions?: No Itching?: No  Eyes Blurred vision?: No Double vision?: No  Ears/Nose/Throat Sore throat?: No Sinus problems?: No  Hematologic/Lymphatic Swollen glands?: No Easy bruising?: No  Cardiovascular Leg swelling?: No Chest pain?: No  Respiratory Cough?: No Shortness of breath?: No  Endocrine Excessive thirst?: No  Musculoskeletal Back pain?: No Joint pain?: No  Neurological Headaches?: No Dizziness?: No  Psychologic Depression?: No Anxiety?: No  Physical Exam: BP 132/73 (BP Location: Left Arm, Patient Position: Sitting, Cuff Size: Normal)   Pulse 72   Ht 6\' 2"  (1.88 m)   Wt 157 lb (71.2 kg)   BMI 20.16 kg/m   Constitutional:  Well nourished. Alert and oriented, No acute distress. HEENT: [place] AT, moist mucus membranes.  Trachea  midline, no masses. Cardiovascular: No clubbing, cyanosis, or edema. Respiratory: Normal respiratory effort, no increased work of breathing. GI: Abdomen is soft, non tender, non distended, no abdominal masses. Liver and spleen not palpable.  No hernias appreciated.  Stool sample for occult testing is not indicated.   GU: No CVA tenderness.  No bladder fullness or masses.  Patient with circumcised phallus.   Urethral meatus is patent.  No penile discharge. No penile lesions or rashes. Scrotum without lesions, cysts, rashes and/or edema.  Testicles are located scrotally bilaterally. No masses are appreciated in the testicles. Left and right epididymis are normal. Rectal: Patient with  normal sphincter tone. Anus and perineum without scarring or rashes. No rectal masses are appreciated. Prostate is approximately 55 grams, no nodules are appreciated. Seminal vesicles could not be palpated.  Skin: No rashes, bruises or suspicious lesions. Lymph: No inguinal adenopathy. Neurologic: Grossly intact, no focal deficits, moving all 4 extremities. Psychiatric: Normal mood and affect.  Assessment & Plan:    1. BPH with LUTS    IPSS score is 1/2, it is improved  Continue tamsulosin 0.4 mg daily and finasteride 5 mg daily; refills given  RTC in 12 months for IPSS and exam  2. Peripheral neuropathy Patient would like a referral to a neurologist as he is concerned his peripheral neuropathy is worsening as he is having similar sensations in his hands     Return in about 1 year (around 04/23/2020) for IPSS, exam and PVR.  These notes generated with voice recognition software. I apologize for typographical errors.  Zara Council, PA-C  [facility] [address] [place] , [place] [address] [phone]  

## 2019-04-24 ENCOUNTER — Encounter: Payer: Self-pay | Admitting: Urology

## 2019-04-24 ENCOUNTER — Other Ambulatory Visit: Payer: Self-pay

## 2019-04-24 ENCOUNTER — Ambulatory Visit (INDEPENDENT_AMBULATORY_CARE_PROVIDER_SITE_OTHER): Payer: Medicare Other | Admitting: Urology

## 2019-04-24 VITALS — BP 132/73 | HR 72 | Ht 74.0 in | Wt 157.0 lb

## 2019-04-24 DIAGNOSIS — N401 Enlarged prostate with lower urinary tract symptoms: Secondary | ICD-10-CM

## 2019-04-24 DIAGNOSIS — N138 Other obstructive and reflux uropathy: Secondary | ICD-10-CM | POA: Diagnosis not present

## 2019-04-24 DIAGNOSIS — M792 Neuralgia and neuritis, unspecified: Secondary | ICD-10-CM | POA: Diagnosis not present

## 2019-04-24 DIAGNOSIS — N400 Enlarged prostate without lower urinary tract symptoms: Secondary | ICD-10-CM | POA: Insufficient documentation

## 2019-04-24 LAB — BLADDER SCAN AMB NON-IMAGING: Scan Result: 75

## 2019-05-13 ENCOUNTER — Other Ambulatory Visit: Payer: Self-pay | Admitting: Urology

## 2019-05-29 ENCOUNTER — Ambulatory Visit (INDEPENDENT_AMBULATORY_CARE_PROVIDER_SITE_OTHER): Payer: 59 | Admitting: *Deleted

## 2019-05-29 DIAGNOSIS — I495 Sick sinus syndrome: Secondary | ICD-10-CM

## 2019-05-30 ENCOUNTER — Other Ambulatory Visit: Payer: Self-pay

## 2019-05-30 ENCOUNTER — Other Ambulatory Visit
Admission: RE | Admit: 2019-05-30 | Discharge: 2019-05-30 | Disposition: A | Discharge status: Home / Self Care | Payer: Medicare Other | Attending: Cardiovascular Disease | Admitting: Cardiovascular Disease

## 2019-05-30 DIAGNOSIS — E782 Mixed hyperlipidemia: Secondary | ICD-10-CM | POA: Diagnosis present

## 2019-05-30 DIAGNOSIS — I4892 Unspecified atrial flutter: Secondary | ICD-10-CM | POA: Diagnosis present

## 2019-05-30 DIAGNOSIS — Z01812 Encounter for preprocedural laboratory examination: Secondary | ICD-10-CM | POA: Insufficient documentation

## 2019-05-30 DIAGNOSIS — R001 Bradycardia, unspecified: Secondary | ICD-10-CM | POA: Insufficient documentation

## 2019-05-30 LAB — CUP PACEART REMOTE DEVICE CHECK
Battery Remaining Longevity: 152 mo
Battery Voltage: 3.19 V
Brady Statistic AP VP Percent: 7.3 %
Brady Statistic AP VS Percent: 27.23 %
Brady Statistic AS VP Percent: 16.92 %
Brady Statistic AS VS Percent: 48.55 %
Brady Statistic RA Percent Paced: 34.44 %
Brady Statistic RV Percent Paced: 24.22 %
Date Time Interrogation Session: 20200902113342
Implantable Lead Implant Date: 20200601
Implantable Lead Implant Date: 20200601
Implantable Lead Location: 753859
Implantable Lead Location: 753860
Implantable Lead Model: 5076
Implantable Lead Model: 5076
Implantable Pulse Generator Implant Date: 20200601
Lead Channel Impedance Value: 342 Ohm
Lead Channel Impedance Value: 399 Ohm
Lead Channel Impedance Value: 475 Ohm
Lead Channel Impedance Value: 475 Ohm
Lead Channel Pacing Threshold Amplitude: 0.5 V
Lead Channel Pacing Threshold Amplitude: 0.75 V
Lead Channel Pacing Threshold Pulse Width: 0.4 ms
Lead Channel Pacing Threshold Pulse Width: 0.4 ms
Lead Channel Sensing Intrinsic Amplitude: 1.875 mV
Lead Channel Sensing Intrinsic Amplitude: 1.875 mV
Lead Channel Sensing Intrinsic Amplitude: 7.625 mV
Lead Channel Sensing Intrinsic Amplitude: 7.625 mV
Lead Channel Setting Pacing Amplitude: 3 V
Lead Channel Setting Pacing Amplitude: 3.25 V
Lead Channel Setting Pacing Pulse Width: 0.4 ms
Lead Channel Setting Sensing Sensitivity: 2 mV

## 2019-05-30 LAB — CBC WITH DIFFERENTIAL/PLATELET
Abs Immature Granulocytes: 0.03 10*3/uL (ref 0.00–0.07)
Basophils Absolute: 0 10*3/uL (ref 0.0–0.1)
Basophils Relative: 1 %
Eosinophils Absolute: 0.1 10*3/uL (ref 0.0–0.5)
Eosinophils Relative: 2 %
HCT: 40.5 % (ref 39.0–52.0)
Hemoglobin: 13.6 g/dL (ref 13.0–17.0)
Immature Granulocytes: 1 %
Lymphocytes Relative: 34 %
Lymphs Abs: 1.9 10*3/uL (ref 0.7–4.0)
MCH: 30.8 pg (ref 26.0–34.0)
MCHC: 33.6 g/dL (ref 30.0–36.0)
MCV: 91.8 fL (ref 80.0–100.0)
Monocytes Absolute: 0.4 10*3/uL (ref 0.1–1.0)
Monocytes Relative: 8 %
Neutro Abs: 3 10*3/uL (ref 1.7–7.7)
Neutrophils Relative %: 54 %
Platelets: 163 10*3/uL (ref 150–400)
RBC: 4.41 MIL/uL (ref 4.22–5.81)
RDW: 12.6 % (ref 11.5–15.5)
WBC: 5.4 10*3/uL (ref 4.0–10.5)
nRBC: 0 % (ref 0.0–0.2)

## 2019-05-30 LAB — HEPATIC FUNCTION PANEL
ALT: 17 U/L (ref 0–44)
AST: 22 U/L (ref 15–41)
Albumin: 4.4 g/dL (ref 3.5–5.0)
Alkaline Phosphatase: 48 U/L (ref 38–126)
Bilirubin, Direct: 0.1 mg/dL (ref 0.0–0.2)
Indirect Bilirubin: 1 mg/dL — ABNORMAL HIGH (ref 0.3–0.9)
Total Bilirubin: 1.1 mg/dL (ref 0.3–1.2)
Total Protein: 7.4 g/dL (ref 6.5–8.1)

## 2019-05-30 LAB — LIPID PANEL
Cholesterol: 126 mg/dL (ref 0–200)
HDL: 44 mg/dL (ref >40)
LDL Cholesterol: 51 mg/dL (ref 0–99)
Total CHOL/HDL Ratio: 2.9 RATIO
Triglycerides: 154 mg/dL — ABNORMAL HIGH (ref <150)
VLDL: 31 mg/dL (ref 0–40)

## 2019-05-30 NOTE — Addendum Note (Signed)
Addended by: Lattie Haw on: 05/30/2019 08:13 AM   Modules accepted: Orders

## 2019-05-30 NOTE — Addendum Note (Signed)
Addended by: Lattie Haw on: 05/30/2019 08:12 AM   Modules accepted: Orders

## 2019-06-04 ENCOUNTER — Other Ambulatory Visit: Payer: Self-pay

## 2019-06-04 ENCOUNTER — Ambulatory Visit (INDEPENDENT_AMBULATORY_CARE_PROVIDER_SITE_OTHER): Payer: Medicare Other | Admitting: Internal Medicine

## 2019-06-04 ENCOUNTER — Encounter: Payer: Self-pay | Admitting: Internal Medicine

## 2019-06-04 VITALS — BP 138/80 | HR 54 | Ht 73.0 in | Wt 184.0 lb

## 2019-06-04 DIAGNOSIS — Z79899 Other long term (current) drug therapy: Secondary | ICD-10-CM | POA: Diagnosis not present

## 2019-06-04 DIAGNOSIS — Z950 Presence of cardiac pacemaker: Secondary | ICD-10-CM

## 2019-06-04 DIAGNOSIS — I4892 Unspecified atrial flutter: Secondary | ICD-10-CM

## 2019-06-04 DIAGNOSIS — R001 Bradycardia, unspecified: Secondary | ICD-10-CM | POA: Diagnosis not present

## 2019-06-04 NOTE — Progress Notes (Signed)
Patient Care Team: Adin Hector, MD as PCP - General (Internal Medicine)   HPI  Kerry Rowland is a 83 y.o. 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  Hx of atrial flutter and fib dating back about 20 yrs.  Had been treated with sotalol and without anticoagulation.   Had noted a gradual decrease in his generally low heart rate from the 60s--50s and then 40s and most recently the 30s associated w/ much more notable fatigue, need to sleep and generalized lassitude.  Perhaps some shortness of breath  Thought maybe Betapace and self titrated his dose downward>>more palpitations. Remains on sotalol; not on anticoagulation   Feels moderately better since pacing, heavier exertion is the greater challenge  The patient denies chest pain  nocturnal dyspnea, orthopnea or peripheral edema.  There have been no palpitations, lightheadedness or syncope.    Date Cr K Hgb  5/20 0.97 4.5 13.6             Past Medical History:  Diagnosis Date  . Acute idiopathic gout of right foot   . Colon polyps   . Constipation   . Erectile dysfunction   . Generalized abdominal mass   . Hyperglycemia   . Osteoarthritis   . Paroxysmal atrial flutter ([place])   . Raynaud's phenomenon   . Skin cancer     Past Surgical History:  Procedure Laterality Date  . APPENDECTOMY    . CATARACT EXTRACTION    . COLONOSCOPY WITH PROPOFOL N/A 05/22/2015   Procedure: COLONOSCOPY WITH PROPOFOL;  Surgeon: Manya Silvas, MD;  Location: [facility] ENDOSCOPY;  Service: Endoscopy;  Laterality: N/A;  . PACEMAKER IMPLANT N/A 02/25/2019   Procedure: PACEMAKER IMPLANT;  Surgeon: Deboraha Sprang, MD;  Location: [place] [facility];  Service: Cardiovascular;  Laterality: N/A;  . skin grafts    . TONSILLECTOMY     Allergies as of 06/04/2019      Reactions   Morphine And Related Other (See Comments)   Chills, felt cold    Nsaids Other (See Comments)   intestinal bleeding   Other    Nightshade  plants - inflammation pain   Phenergan [promethazine Hcl]    Uncontrolled muscle movement       Medication List       Accurate as of June 04, 2019 10:55 AM. If you have any questions, ask your nurse or doctor.        b complex vitamins tablet Take 1 tablet by mouth daily.   B-12 PO Take 1 tablet by mouth daily.   BLACK PEPPER-TURMERIC PO Take 1 Dose by mouth daily.   cetirizine 10 MG tablet Commonly known as: ZYRTEC Take 10 mg by mouth daily as needed for allergies.   CINNAMON PO Take 1 Dose by mouth daily.   cyclobenzaprine 10 MG tablet Commonly known as: FLEXERIL Take 10 mg by mouth 3 (three) times daily as needed for muscle spasms.   diphenhydrAMINE 25 MG tablet Commonly known as: BENADRYL Take 25 mg by mouth at bedtime as needed for sleep.   finasteride 5 MG tablet Commonly known as: PROSCAR Take 1 tablet (5 mg total) by mouth daily. What changed: when to take this   MAGNESIUM PO Take 1 tablet by mouth daily.   Multi-Minerals Tabs Take 1 tablet by mouth daily.   Multi-Vitamins Tabs Take 1 tablet by mouth daily.   OVER THE COUNTER MEDICATION Take 1 Dose by mouth daily. Lion's  mane otc supplement   OVER THE COUNTER MEDICATION Take 1 Dose by mouth daily. reishi otc supplement   PANTOTHENIC ACID PO Take 1 tablet by mouth daily.   propranolol 20 MG tablet Commonly known as: INDERAL Take 1 tablet (20 mg total) by mouth 3 (three) times daily as needed. For extra beats. What changed:   reasons to take this  additional instructions   rosuvastatin 10 MG tablet Commonly known as: CRESTOR Take 1 tablet (10 mg total) by mouth daily.   SAM-E COMPLETE PO Take 1 tablet by mouth daily.   sotalol 120 MG tablet Commonly known as: BETAPACE Take 120 mg by mouth 2 (two) times daily.   tamsulosin 0.4 MG Caps capsule Commonly known as: FLOMAX Take 1 capsule (0.4 mg total) by mouth daily.   VITAMIN B-2 PO Take 1 tablet by mouth daily.   VITAMIN C  PO Take 1 tablet by mouth daily.   VITAMIN D PO Take 1 tablet by mouth daily.   VITAMIN E PO Take 1 capsule by mouth daily.   ZINC PO Take 1 tablet by mouth daily.        Allergies  Allergen Reactions  . Morphine And Related Other (See Comments)    Chills, felt cold   . Nsaids Other (See Comments)    intestinal bleeding  . Other     Nightshade plants - inflammation pain  . Phenergan [Promethazine Hcl]     Uncontrolled muscle movement       Review of Systems negative except from HPI and PMH  Physical Exam BP 138/80 (BP Location: Left Arm, Patient Position: Sitting, Cuff Size: Normal)   Pulse (!) 54   Ht 6\' 1"  (1.854 m)   Wt 184 lb (83.5 kg)   SpO2 97%   BMI 24.28 kg/m  Well developed and well nourished in no acute distress HENT normal E scleral and icterus clear Neck Supple JVP flat;   Clear to ausculation Device pocket well healed; without hematoma or erythema.  There is no tethering  Regular rate and rhythm, no murmurs gallops or rub Soft with active bowel sounds No clubbing cyanosis  Edema Alert and oriented, grossly normal motor and sensory function Skin Warm and Dry  P-synchronous/ AV  pacing   Assessment and  Plan  Atrial flutter/fibrillation-by history  Sinus bradycardia/first-degree AV block  Pacemaker Medtronic  AV block 2: 1 mostly type I  Fatigue  Hyperlipidemia    With chronotropic incompetence, will increase lower rate limit 50>>60 and activate rate response  We discussed his statins.  Reviewing UptoDate on primary prevention in the elderly, ( described as >75) there is modest benefit-- I have suggested he could decrease his dose from 10 >> 5 resouvastatin  We spent more than 50% of our >25 min visit in face to face counseling regarding the above       Current medicines are reviewed at length with the patient today .  The patient does not  have concerns regarding medicines.

## 2019-06-04 NOTE — Patient Instructions (Addendum)
Medication Instructions:  - Your physician recommends that you continue on your current medications as directed. Please refer to the Current Medication list given to you today.  If you need a refill on your cardiac medications before your next appointment, please call your pharmacy.   Lab work: - Your physician recommends that you have lab work today: Magnesium  If you have labs (blood work) drawn today and your tests are completely normal, you will receive your results only by: Marland Kitchen MyChart Message (if you have MyChart) OR . A paper copy in the mail If you have any lab test that is abnormal or we need to change your treatment, we will call you to review the results.  Testing/Procedures: - none ordered  Follow-Up: At [facility], you and your health needs are our priority.  As part of our continuing mission to provide you with exceptional heart care, we have created designated Provider Care Teams.  These Care Teams include your primary Cardiologist (physician) and Advanced Practice Providers (APPs -  Physician Assistants and Nurse Practitioners) who all work together to provide you with the care you need, when you need it.  - You will need a follow up appointment in 9 months (June 2021)  - Please call our office 2 months in advance to schedule this appointment.   - Remote monitoring is used to monitor your Pacemaker of ICD from home. This monitoring reduces the number of office visits required to check your device to one time per year. It allows [place] to keep an eye on the functioning of your device to ensure it is working properly. You are scheduled for a device check from home on 08/28/19. You may send your transmission at any time that day. If you have a wireless device, the transmission will be sent automatically. After your physician reviews your transmission, you will receive a postcard with your next transmission date.   Any Other Special Instructions Will Be Listed Below (If  Applicable). - N/A

## 2019-06-05 LAB — MAGNESIUM: Magnesium: 2.1 mg/dL (ref 1.6–2.3)

## 2019-06-13 ENCOUNTER — Encounter: Payer: Self-pay | Admitting: Cardiology

## 2019-06-13 NOTE — Progress Notes (Signed)
Remote pacemaker transmission.   

## 2019-06-13 NOTE — Addendum Note (Signed)
Addended by: Tiajuana Amass on: 06/13/2019 04:36 PM   Modules accepted: Level of Service

## 2019-06-28 ENCOUNTER — Other Ambulatory Visit: Payer: Self-pay

## 2019-06-28 ENCOUNTER — Other Ambulatory Visit: Payer: Self-pay | Admitting: Urology

## 2019-06-28 MED ORDER — ROSUVASTATIN CALCIUM 10 MG PO TABS: 10.0000 mg | ORAL_TABLET | Freq: Every day | ORAL | 3 refills | Status: DC

## 2019-07-03 LAB — CUP PACEART INCLINIC DEVICE CHECK
Brady Statistic RA Percent Paced: 32.6 %
Brady Statistic RV Percent Paced: 25.1 %
Date Time Interrogation Session: 20201007121535
Implantable Lead Implant Date: 20200601
Implantable Lead Implant Date: 20200601
Implantable Lead Location: 753859
Implantable Lead Location: 753860
Implantable Lead Model: 5076
Implantable Lead Model: 5076
Implantable Pulse Generator Implant Date: 20200601
Lead Channel Impedance Value: 437 Ohm
Lead Channel Impedance Value: 456 Ohm
Lead Channel Pacing Threshold Amplitude: 0.5 V
Lead Channel Pacing Threshold Amplitude: 0.75 V
Lead Channel Pacing Threshold Pulse Width: 0.4 ms
Lead Channel Pacing Threshold Pulse Width: 0.4 ms
Lead Channel Sensing Intrinsic Amplitude: 2.1 mV
Lead Channel Sensing Intrinsic Amplitude: 7.6 mV
Lead Channel Setting Pacing Amplitude: 1.5 V
Lead Channel Setting Pacing Amplitude: 2.5 V
Lead Channel Setting Pacing Pulse Width: 0.4 ms
Lead Channel Setting Sensing Sensitivity: 2 mV

## 2019-08-28 ENCOUNTER — Ambulatory Visit (INDEPENDENT_AMBULATORY_CARE_PROVIDER_SITE_OTHER): Payer: Medicare Other | Admitting: *Deleted

## 2019-08-28 DIAGNOSIS — I495 Sick sinus syndrome: Secondary | ICD-10-CM | POA: Diagnosis not present

## 2019-08-28 LAB — CUP PACEART REMOTE DEVICE CHECK
Battery Remaining Longevity: 141 mo
Battery Voltage: 3.14 V
Brady Statistic AP VP Percent: 97.84 %
Brady Statistic AP VS Percent: 0.25 %
Brady Statistic AS VP Percent: 1.84 %
Brady Statistic AS VS Percent: 0.07 %
Brady Statistic RA Percent Paced: 98.08 %
Brady Statistic RV Percent Paced: 99.68 %
Date Time Interrogation Session: 20201201210440
Implantable Lead Implant Date: 20200601
Implantable Lead Implant Date: 20200601
Implantable Lead Location: 753859
Implantable Lead Location: 753860
Implantable Lead Model: 5076
Implantable Lead Model: 5076
Implantable Pulse Generator Implant Date: 20200601
Lead Channel Impedance Value: 304 Ohm
Lead Channel Impedance Value: 380 Ohm
Lead Channel Impedance Value: 437 Ohm
Lead Channel Impedance Value: 475 Ohm
Lead Channel Pacing Threshold Amplitude: 0.625 V
Lead Channel Pacing Threshold Amplitude: 0.75 V
Lead Channel Pacing Threshold Pulse Width: 0.4 ms
Lead Channel Pacing Threshold Pulse Width: 0.4 ms
Lead Channel Sensing Intrinsic Amplitude: 2.25 mV
Lead Channel Sensing Intrinsic Amplitude: 2.25 mV
Lead Channel Sensing Intrinsic Amplitude: 7.625 mV
Lead Channel Sensing Intrinsic Amplitude: 7.625 mV
Lead Channel Setting Pacing Amplitude: 1.5 V
Lead Channel Setting Pacing Amplitude: 2.5 V
Lead Channel Setting Pacing Pulse Width: 0.4 ms
Lead Channel Setting Sensing Sensitivity: 2 mV

## 2019-09-23 NOTE — Progress Notes (Signed)
PPM remote 

## 2019-11-12 ENCOUNTER — Other Ambulatory Visit: Payer: Self-pay | Admitting: Family Medicine

## 2019-11-12 MED ORDER — FINASTERIDE 5 MG PO TABS: 5.0000 mg | ORAL_TABLET | Freq: Every day | ORAL | 3 refills | Status: DC

## 2019-11-22 ENCOUNTER — Other Ambulatory Visit: Payer: Self-pay | Admitting: *Deleted

## 2019-11-22 MED ORDER — TAMSULOSIN HCL 0.4 MG PO CAPS: 0.4000 mg | ORAL_CAPSULE | Freq: Every day | ORAL | 3 refills | Status: DC

## 2019-11-27 ENCOUNTER — Ambulatory Visit (INDEPENDENT_AMBULATORY_CARE_PROVIDER_SITE_OTHER): Payer: Medicare Other | Admitting: *Deleted

## 2019-11-27 DIAGNOSIS — I495 Sick sinus syndrome: Secondary | ICD-10-CM

## 2019-11-27 LAB — CUP PACEART REMOTE DEVICE CHECK
Battery Remaining Longevity: 132 mo
Battery Voltage: 3.07 V
Brady Statistic AP VP Percent: 98.39 %
Brady Statistic AP VS Percent: 0.02 %
Brady Statistic AS VP Percent: 1.56 %
Brady Statistic AS VS Percent: 0.03 %
Brady Statistic RA Percent Paced: 98.42 %
Brady Statistic RV Percent Paced: 99.95 %
Date Time Interrogation Session: 20210302191009
Implantable Lead Implant Date: 20200601
Implantable Lead Implant Date: 20200601
Implantable Lead Location: 753859
Implantable Lead Location: 753860
Implantable Lead Model: 5076
Implantable Lead Model: 5076
Implantable Pulse Generator Implant Date: 20200601
Lead Channel Impedance Value: 304 Ohm
Lead Channel Impedance Value: 380 Ohm
Lead Channel Impedance Value: 437 Ohm
Lead Channel Impedance Value: 475 Ohm
Lead Channel Pacing Threshold Amplitude: 0.625 V
Lead Channel Pacing Threshold Amplitude: 0.75 V
Lead Channel Pacing Threshold Pulse Width: 0.4 ms
Lead Channel Pacing Threshold Pulse Width: 0.4 ms
Lead Channel Sensing Intrinsic Amplitude: 2.375 mV
Lead Channel Sensing Intrinsic Amplitude: 2.375 mV
Lead Channel Sensing Intrinsic Amplitude: 6.625 mV
Lead Channel Sensing Intrinsic Amplitude: 6.625 mV
Lead Channel Setting Pacing Amplitude: 1.5 V
Lead Channel Setting Pacing Amplitude: 2.5 V
Lead Channel Setting Pacing Pulse Width: 0.4 ms
Lead Channel Setting Sensing Sensitivity: 2 mV

## 2019-11-27 NOTE — Progress Notes (Signed)
PPM Remote  

## 2020-01-29 ENCOUNTER — Telehealth: Payer: Self-pay

## 2020-01-29 NOTE — Telephone Encounter (Signed)
Unscheduled remote transmission received.  No recent episodes noted.  There was an episode of NSVT on 01/16/20 lasting 19beats.    Spoke with pt, he sent transmission because he had noticed on his apple watch that his HR was in the 30s at times.  Pt denies any symptoms.  Explained that Apple watch HR is not necessarily accurate if he is having PVCs/ PACs.    D/W patient episode on 01/16/20, pt does not recall any symptoms on that date or what he might have been doing.    Last echo completed 02/22/19.  EF 50-55%.

## 2020-01-30 NOTE — Telephone Encounter (Signed)
VTNS noted

## 2020-02-26 ENCOUNTER — Ambulatory Visit (INDEPENDENT_AMBULATORY_CARE_PROVIDER_SITE_OTHER): Payer: Medicare Other | Admitting: *Deleted

## 2020-02-26 DIAGNOSIS — I441 Atrioventricular block, second degree: Secondary | ICD-10-CM

## 2020-02-26 DIAGNOSIS — I495 Sick sinus syndrome: Secondary | ICD-10-CM | POA: Diagnosis not present

## 2020-02-26 LAB — CUP PACEART REMOTE DEVICE CHECK
Battery Remaining Longevity: 125 mo
Battery Voltage: 3.03 V
Brady Statistic AP VP Percent: 99.12 %
Brady Statistic AP VS Percent: 0 %
Brady Statistic AS VP Percent: 0.88 %
Brady Statistic AS VS Percent: 0 %
Brady Statistic RA Percent Paced: 99.1 %
Brady Statistic RV Percent Paced: 99.99 %
Date Time Interrogation Session: 20210601212941
Implantable Lead Implant Date: 20200601
Implantable Lead Implant Date: 20200601
Implantable Lead Location: 753859
Implantable Lead Location: 753860
Implantable Lead Model: 5076
Implantable Lead Model: 5076
Implantable Pulse Generator Implant Date: 20200601
Lead Channel Impedance Value: 285 Ohm
Lead Channel Impedance Value: 380 Ohm
Lead Channel Impedance Value: 437 Ohm
Lead Channel Impedance Value: 437 Ohm
Lead Channel Pacing Threshold Amplitude: 0.625 V
Lead Channel Pacing Threshold Amplitude: 0.75 V
Lead Channel Pacing Threshold Pulse Width: 0.4 ms
Lead Channel Pacing Threshold Pulse Width: 0.4 ms
Lead Channel Sensing Intrinsic Amplitude: 2.125 mV
Lead Channel Sensing Intrinsic Amplitude: 2.125 mV
Lead Channel Sensing Intrinsic Amplitude: 7.375 mV
Lead Channel Sensing Intrinsic Amplitude: 7.375 mV
Lead Channel Setting Pacing Amplitude: 1.5 V
Lead Channel Setting Pacing Amplitude: 2.5 V
Lead Channel Setting Pacing Pulse Width: 0.4 ms
Lead Channel Setting Sensing Sensitivity: 2 mV

## 2020-03-02 NOTE — Progress Notes (Signed)
Remote pacemaker transmission.   

## 2020-03-17 ENCOUNTER — Encounter: Payer: Self-pay | Admitting: Internal Medicine

## 2020-03-17 ENCOUNTER — Other Ambulatory Visit: Payer: Self-pay

## 2020-03-17 ENCOUNTER — Ambulatory Visit (INDEPENDENT_AMBULATORY_CARE_PROVIDER_SITE_OTHER): Payer: Medicare Other | Admitting: Internal Medicine

## 2020-03-17 VITALS — BP 139/82 | HR 61 | Ht 74.0 in | Wt 171.0 lb

## 2020-03-17 DIAGNOSIS — I4892 Unspecified atrial flutter: Secondary | ICD-10-CM | POA: Diagnosis not present

## 2020-03-17 DIAGNOSIS — I495 Sick sinus syndrome: Secondary | ICD-10-CM

## 2020-03-17 DIAGNOSIS — Z79899 Other long term (current) drug therapy: Secondary | ICD-10-CM

## 2020-03-17 DIAGNOSIS — Z950 Presence of cardiac pacemaker: Secondary | ICD-10-CM

## 2020-03-17 NOTE — Progress Notes (Signed)
Patient Care Team: Adin Hector, MD as PCP - General (Internal Medicine)   HPI  Kerry Rowland is a 84 y.o. 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  Hx of atrial flutter and fib dating back about 20 yrs.  Had been treated with sotalol and without anticoagulation.   Had noted a gradual decrease in his generally low heart rate from the 60s--50s and then 40s and most recently the 30s associated w/ much more notable fatigue, need to sleep and generalized lassitude.  Perhaps some shortness of breath  Thought maybe Betapace and self titrated his dose downward>>more palpitations. Remains on sotalol; not on anticoagulation   Much better since pacing  Some variability in day-to-day status.  Not sure with what it correlates.  Question food question sleep disturbances.  Atypical chest pains, duration about 2 minutes.  Unassociated with exertion.  Some orthostatic lightheadedness.  No presyncope     Date Cr K Hgb Mg  5/20 0.97 4.5 13.6   6./21 1.2 4.2 13.6        Past Medical History:  Diagnosis Date  . Acute idiopathic gout of right foot   . Colon polyps   . Constipation   . Erectile dysfunction   . Generalized abdominal mass   . Hyperglycemia   . Osteoarthritis   . Paroxysmal atrial flutter ([place])   . Raynaud's phenomenon   . Skin cancer     Past Surgical History:  Procedure Laterality Date  . APPENDECTOMY    . CATARACT EXTRACTION    . COLONOSCOPY WITH PROPOFOL N/A 05/22/2015   Procedure: COLONOSCOPY WITH PROPOFOL;  Surgeon: Manya Silvas, MD;  Location: [facility] ENDOSCOPY;  Service: Endoscopy;  Laterality: N/A;  . PACEMAKER IMPLANT N/A 02/25/2019   Procedure: PACEMAKER IMPLANT;  Surgeon: Deboraha Sprang, MD;  Location: [facility];  Service: Cardiovascular;  Laterality: N/A;  . skin grafts    . TONSILLECTOMY     Allergies as of 03/17/2020      Reactions   Morphine And Related Other (See Comments)   Chills, felt cold     Nsaids Other (See Comments)   intestinal bleeding   Other    Nightshade plants - inflammation pain   Phenergan [promethazine Hcl]    Uncontrolled muscle movement       Medication List       Accurate as of March 17, 2020 12:08 PM. If you have any questions, ask your nurse or doctor.        b complex vitamins tablet Take 1 tablet by mouth daily.   B-12 PO Take 1 tablet by mouth daily.   BLACK PEPPER-TURMERIC PO Take 1 Dose by mouth daily.   cetirizine 10 MG tablet Commonly known as: ZYRTEC Take 10 mg by mouth daily as needed for allergies.   CINNAMON PO Take 1 Dose by mouth daily.   cyclobenzaprine 10 MG tablet Commonly known as: FLEXERIL Take 10 mg by mouth 3 (three) times daily as needed for muscle spasms.   diphenhydrAMINE 25 MG tablet Commonly known as: BENADRYL Take 25 mg by mouth at bedtime as needed for sleep.   finasteride 5 MG tablet Commonly known as: PROSCAR Take 1 tablet (5 mg total) by mouth daily.   MAGNESIUM PO Take 1 tablet by mouth daily.   Multi-Minerals Tabs Take 1 tablet by mouth daily.   Multi-Vitamins Tabs Take 1 tablet by mouth daily.   NON FORMULARY flourouracil 5%+ calcipotriene  0.005% apply BID to forehead, cheeks, temple, ears, and nose   OVER THE COUNTER MEDICATION Take 1 Dose by mouth daily. Lion's mane otc supplement   OVER THE COUNTER MEDICATION Take 1 Dose by mouth daily. reishi otc supplement   PANTOTHENIC ACID PO Take 1 tablet by mouth daily.   propranolol 20 MG tablet Commonly known as: INDERAL Take 20 mg by mouth 3 (three) times daily as needed. What changed: Another medication with the same name was changed. Make sure you understand how and when to take each.   propranolol 20 MG tablet Commonly known as: INDERAL Take 1 tablet (20 mg total) by mouth 3 (three) times daily as needed. For extra beats. What changed:   reasons to take this  additional instructions   rosuvastatin 10 MG tablet Commonly known as:  CRESTOR Take 1 tablet (10 mg total) by mouth daily.   SAM-E COMPLETE PO Take 1 tablet by mouth daily.   sotalol 120 MG tablet Commonly known as: BETAPACE Take 120 mg by mouth 2 (two) times daily.   tamsulosin 0.4 MG Caps capsule Commonly known as: FLOMAX Take 1 capsule (0.4 mg total) by mouth daily.   VITAMIN B-2 PO Take 1 tablet by mouth daily.   VITAMIN C PO Take 1 tablet by mouth daily.   VITAMIN D PO Take 1 tablet by mouth daily.   VITAMIN E PO Take 1 capsule by mouth daily.   ZINC PO Take 1 tablet by mouth daily.        Allergies  Allergen Reactions  . Morphine And Related Other (See Comments)    Chills, felt cold   . Nsaids Other (See Comments)    intestinal bleeding  . Other     Nightshade plants - inflammation pain  . Phenergan [Promethazine Hcl]     Uncontrolled muscle movement       Review of Systems negative except from HPI and PMH  Physical Exam BP 139/82 (BP Location: Left Arm, Patient Position: Sitting, Cuff Size: Normal)   Pulse 61   Ht 6\' 2"  (1.88 m)   Wt 171 lb (77.6 kg)   SpO2 98%   BMI 21.96 kg/m  Well developed and well nourished in no acute distress HENT normal Neck supple with JVP-flat Clear Device pocket well healed; without hematoma or erythema.  There is no tethering  Regular rate and rhythm, no  murmur Abd-soft with active BS No Clubbing cyanosis   edema Skin-warm and dry A & Oriented  Grossly normal sensory and motor function  ECG AV pacing    Assessment and  Plan  Atrial flutter/fibrillation-by history  Sinus bradycardia/first-degree AV block  Pacemaker Medtronic  AV block complete  Orthostatic lightheadedness  High Risk Medication Surveillance   Hyperlipidemia   Interval development of complete heart block.  Typically associated in part with reprogramming of his device in September.  No longer with intrinsic conduction.  Have discussed with him the possibility of pacemaker cardiomyopathy but no  symptoms at this point is suggested.  Remains on sotalol.  Will need surveillance laboratories.  Specifically magnesium.  Reviewed the physiology of orthostatic lightheadedness and asked him to follow-up with his urologist to see if there is any modifications to his alpha blockers       Current medicines are reviewed at length with the patient today .  The patient does not  have concerns regarding medicines.

## 2020-03-17 NOTE — Patient Instructions (Signed)
Medication Instructions:  - Your physician recommends that you continue on your current medications as directed. Please refer to the Current Medication list given to you today.  *If you need a refill on your cardiac medications before your next appointment, please call your pharmacy*   Lab Work: - Your physician recommends that you have lab work: Magnesium level - you have been given a paper order to take with you  - please take this order with you when you have your next lab draw done for Dr. Caryl Comes (PCP)  If you have labs (blood work) drawn today and your tests are completely normal, you will receive your results only by: Marland Kitchen MyChart Message (if you have MyChart) OR . A paper copy in the mail If you have any lab test that is abnormal or we need to change your treatment, we will call you to review the results.   Testing/Procedures: - none ordered   Follow-Up: At [facility], you and your health needs are our priority.  As part of our continuing mission to provide you with exceptional heart care, we have created designated Provider Care Teams.  These Care Teams include your primary Cardiologist (physician) and Advanced Practice Providers (APPs -  Physician Assistants and Nurse Practitioners) who all work together to provide you with the care you need, when you need it.  We recommend signing up for the patient portal called "MyChart".  Sign up information is provided on this After Visit Summary.  MyChart is used to connect with patients for Virtual Visits (Telemedicine).  Patients are able to view lab/test results, encounter notes, upcoming appointments, etc.  Non-urgent messages can be sent to your provider as well.   To learn more about what you can do with MyChart, go to [email or web address].    Your next appointment:   6 month(s)  The format for your next appointment:   In Person  Provider:   [name], MD   Other Instructions n/a

## 2020-04-22 NOTE — Progress Notes (Signed)
04/23/2020 4:11 PM   Kerry Rowland Oct 12, 1935 [id number]  Referring provider: Adin Hector, MD [facility] [place],  [place] [address]  Chief Complaint  Patient presents with  . Benign Prostatic Hypertrophy    HPI: Kerry Rowland is an 84 y.o. male with erectile dysfunction, testosterone deficiency and BPH with LUTS who presents today for a one year follow up.    BPH WITH LUTS His IPSS score today is 9, which is moderate lower urinary tract symptomatology.  He is mostly satisfied with his quality life due to his urinary symptoms.  His PVR is 75 mL.  He denies any dysuria, hematuria or suprapubic pain.  His previous I PSS score was 1/2.  He currently taking tamsulosin 0.4 mg daily and finasteride 5 mg daily.    He also denies any recent fevers, chills, nausea or vomiting.  He does not have a family history of PCa.  His peripheral neuropathy is stable.  He is having issues with hypotension, so he would like to take something different than the tamsulosin for his urinary symptoms that will not affect his blood pressure.  He denies any incontinence of stool or urine.  He is experiencing some post void dribbling.     IPSS    Row Name 04/23/20 1500         International Prostate Symptom Score   How often have you had the sensation of not emptying your bladder? Not at All     How often have you had to urinate less than every two hours? Less than 1 in 5 times     How often have you found you stopped and started again several times when you urinated? About half the time     How often have you found it difficult to postpone urination? Not at All     How often have you had a weak urinary stream? More than half the time     How often have you had to strain to start urination? Not at All     How many times did you typically get up at night to urinate? 1 Time     Total IPSS Score 9       Quality of Life due to urinary symptoms   If you were to spend the  rest of your life with your urinary condition just the way it is now how would you feel about that? Mostly Satisfied            Score:  1-7 Mild 8-19 Moderate 20-35 Severe    PMH: Past Medical History:  Diagnosis Date  . Acute idiopathic gout of right foot   . Colon polyps   . Constipation   . Erectile dysfunction   . Generalized abdominal mass   . Hyperglycemia   . Osteoarthritis   . Paroxysmal atrial flutter ([place])   . Raynaud's phenomenon   . Skin cancer     Surgical History: Past Surgical History:  Procedure Laterality Date  . APPENDECTOMY    . CATARACT EXTRACTION    . COLONOSCOPY WITH PROPOFOL N/A 05/22/2015   Procedure: COLONOSCOPY WITH PROPOFOL;  Surgeon: Manya Silvas, MD;  Location: [facility] ENDOSCOPY;  Service: Endoscopy;  Laterality: N/A;  . PACEMAKER IMPLANT N/A 02/25/2019   Procedure: PACEMAKER IMPLANT;  Surgeon: Deboraha Sprang, MD;  Location: [facility];  Service: Cardiovascular;  Laterality: N/A;  . skin grafts    . TONSILLECTOMY  Home Medications:  Allergies as of 04/23/2020      Reactions   Morphine And Related Other (See Comments)   Chills, felt cold    Nsaids Other (See Comments)   intestinal bleeding   Other    Nightshade plants - inflammation pain   Phenergan [promethazine Hcl]    Uncontrolled muscle movement       Medication List       Accurate as of April 23, 2020  4:11 PM. If you have any questions, ask your nurse or doctor.        Alpha-Lipoic Acid 300 MG Caps Take by mouth.   b complex vitamins tablet Take 1 tablet by mouth daily.   B-12 PO Take 1 tablet by mouth daily.   BLACK PEPPER-TURMERIC PO Take 1 Dose by mouth daily.   cetirizine 10 MG tablet Commonly known as: ZYRTEC Take 10 mg by mouth daily as needed for allergies.   CINNAMON PO Take 1 Dose by mouth daily.   cyclobenzaprine 10 MG tablet Commonly known as: FLEXERIL Take 10 mg by mouth 3 (three) times daily as needed for muscle spasms.     diphenhydrAMINE 25 MG tablet Commonly known as: BENADRYL Take 25 mg by mouth at bedtime as needed for sleep.   finasteride 5 MG tablet Commonly known as: PROSCAR Take 1 tablet (5 mg total) by mouth daily.   MAGNESIUM PO Take 1 tablet by mouth daily.   Multi-Minerals Tabs Take 1 tablet by mouth daily.   Multi-Vitamins Tabs Take 1 tablet by mouth daily.   NON FORMULARY flourouracil 5%+ calcipotriene 0.005% apply BID to forehead, cheeks, temple, ears, and nose   OVER THE COUNTER MEDICATION Take 1 Dose by mouth daily. Lion's mane otc supplement   OVER THE COUNTER MEDICATION Take 1 Dose by mouth daily. reishi otc supplement   PANTOTHENIC ACID PO Take 1 tablet by mouth daily.   propranolol 20 MG tablet Commonly known as: INDERAL Take 20 mg by mouth 3 (three) times daily as needed. What changed: Another medication with the same name was changed. Make sure you understand how and when to take each.   propranolol 20 MG tablet Commonly known as: INDERAL Take 1 tablet (20 mg total) by mouth 3 (three) times daily as needed. For extra beats. What changed:   reasons to take this  additional instructions   rosuvastatin 10 MG tablet Commonly known as: CRESTOR Take 1 tablet (10 mg total) by mouth daily.   SAM-E COMPLETE PO Take 1 tablet by mouth daily.   sotalol 120 MG tablet Commonly known as: BETAPACE Take 120 mg by mouth 2 (two) times daily.   tadalafil 5 MG tablet Commonly known as: CIALIS Take 1 tablet (5 mg total) by mouth daily as needed for erectile dysfunction. Started by: Zara Council, PA-C   tamsulosin 0.4 MG Caps capsule Commonly known as: FLOMAX Take 1 capsule (0.4 mg total) by mouth daily.   VITAMIN B-2 PO Take 1 tablet by mouth daily.   VITAMIN C PO Take 1 tablet by mouth daily.   VITAMIN D PO Take 1 tablet by mouth daily.   VITAMIN E PO Take 1 capsule by mouth daily.   ZINC PO Take 1 tablet by mouth daily.       Allergies:   Allergies  Allergen Reactions  . Morphine And Related Other (See Comments)    Chills, felt cold   . Nsaids Other (See Comments)    intestinal bleeding  . Other  Nightshade plants - inflammation pain  . Phenergan [Promethazine Hcl]     Uncontrolled muscle movement     Family History: Family History  Problem Relation Age of Onset  . Kidney disease Neg Hx   . Prostate cancer Neg Hx   . Kidney cancer Neg Hx   . Bladder Cancer Neg Hx     Social History:  reports that he has quit smoking. His smoking use included cigarettes. He has never used smokeless tobacco. He reports current alcohol use. He reports that he does not use drugs.  ROS: For pertinent review of systems please refer to history of present illness  Physical Exam: BP (!) 136/77 (BP Location: Left Arm, Patient Position: Sitting, Cuff Size: Normal)   Pulse 69   Ht 6\' 1"  (1.854 m)   Wt 169 lb 8 oz (76.9 kg)   BMI 22.36 kg/m   Constitutional:  Well nourished. Alert and oriented, No acute distress. HEENT: [place] AT, mask in place.  Trachea midline Cardiovascular: No clubbing, cyanosis, or edema. Respiratory: Normal respiratory effort, no increased work of breathing. GI: Abdomen is soft, non tender, non distended, no abdominal masses. Liver and spleen not palpable.  No hernias appreciated.  Stool sample for occult testing is not indicated.   GU: No CVA tenderness.  No bladder fullness or masses.  Patient with circumcised phallus. Urethral meatus is patent.  No penile discharge. No penile lesions or rashes. Scrotum without lesions, cysts, rashes and/or edema.  Testicles are located scrotally bilaterally. No masses are appreciated in the testicles. Left and right epididymis are normal. Rectal: Patient with  normal sphincter tone. Anus and perineum without scarring or rashes. No rectal masses are appreciated. Prostate is approximately 55 grams, no nodules are appreciated. Seminal vesicles could not be palpated.   Skin: No  rashes, bruises or suspicious lesions. Lymph: No inguinal adenopathy. Neurologic: Grossly intact, no focal deficits, moving all 4 extremities. Psychiatric: Normal mood and affect.  Assessment & Plan:    1. BPH with LUTS    IPSS score is 9/2, it is worsened Discontinue tamsulosin 0.4 mg daily Continue finasteride 5 mg daily Start Cialis 5 mg daily - [facility] to [facility] in 12 months for IPSS and exam     Return in about 1 year (around 04/23/2021) for I PSS and exam .  These notes generated with voice recognition software. I apologize for typographical errors.  Zara Council, PA-C  [facility] [address] [place], [place] [address] [phone]

## 2020-04-23 ENCOUNTER — Ambulatory Visit (INDEPENDENT_AMBULATORY_CARE_PROVIDER_SITE_OTHER): Payer: Medicare Other | Admitting: Urology

## 2020-04-23 ENCOUNTER — Encounter: Payer: Self-pay | Admitting: Urology

## 2020-04-23 ENCOUNTER — Other Ambulatory Visit: Payer: Self-pay

## 2020-04-23 VITALS — BP 136/77 | HR 69 | Ht 73.0 in | Wt 169.5 lb

## 2020-04-23 DIAGNOSIS — N401 Enlarged prostate with lower urinary tract symptoms: Secondary | ICD-10-CM | POA: Diagnosis not present

## 2020-04-23 DIAGNOSIS — N138 Other obstructive and reflux uropathy: Secondary | ICD-10-CM | POA: Diagnosis not present

## 2020-04-23 MED ORDER — TADALAFIL 5 MG PO TABS: 5.0000 mg | ORAL_TABLET | Freq: Every day | ORAL | 0 refills | Status: DC | PRN

## 2020-05-11 ENCOUNTER — Telehealth: Payer: Self-pay | Admitting: Emergency Medicine

## 2020-05-11 NOTE — Telephone Encounter (Signed)
The pt returning Kerry Rowland phone call. He said she can call him back at [phone].

## 2020-05-11 NOTE — Telephone Encounter (Signed)
Patient reports of on 05/08/20 he had a long day traveling back from out of town and in the evening attended a funeral. Patient denies any complaints during this time or over the past several days. Patient reports of 1 episode last week, unsure of certain day, brief episode of left sided chest pain, described as sharp, lasted approx. 30 seconds. Patient education given in regard to chest pain and ED precautions. Patient reports compliant with medications Sotalol, Inderal. No OAC as previously noted by Dr. Caryl Comes.  Requested patient send manual transmission for updated presenting rhythm.

## 2020-05-11 NOTE — Telephone Encounter (Signed)
LMOM to call office [facility] # and office hours provided.  Presenting rhythm on 05/08/20 was AFL. Assess for symptoms.

## 2020-05-12 NOTE — Telephone Encounter (Signed)
Leigh  Could you please let me know the duration of the afib episode Thanks SK

## 2020-05-12 NOTE — Telephone Encounter (Signed)
  Updated manual transmission Presenting: AP/VS 70 bpm

## 2020-05-13 NOTE — Telephone Encounter (Signed)
Thank you so much. Noted.

## 2020-05-13 NOTE — Telephone Encounter (Signed)
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

## 2020-05-13 NOTE — Telephone Encounter (Signed)
Sure!   Episode appears to last 8 hours 28 minutes 47 seconds.

## 2020-05-27 ENCOUNTER — Ambulatory Visit (INDEPENDENT_AMBULATORY_CARE_PROVIDER_SITE_OTHER): Payer: Medicare Other | Admitting: *Deleted

## 2020-05-27 DIAGNOSIS — I441 Atrioventricular block, second degree: Secondary | ICD-10-CM

## 2020-05-28 LAB — CUP PACEART REMOTE DEVICE CHECK
Battery Remaining Longevity: 121 mo
Battery Voltage: 3.01 V
Brady Statistic AP VP Percent: 99.3 %
Brady Statistic AP VS Percent: 0 %
Brady Statistic AS VP Percent: 0.7 %
Brady Statistic AS VS Percent: 0 %
Brady Statistic RA Percent Paced: 99.28 %
Brady Statistic RV Percent Paced: 100 %
Date Time Interrogation Session: 20210901032216
Implantable Lead Implant Date: 20200601
Implantable Lead Implant Date: 20200601
Implantable Lead Location: 753859
Implantable Lead Location: 753860
Implantable Lead Model: 5076
Implantable Lead Model: 5076
Implantable Pulse Generator Implant Date: 20200601
Lead Channel Impedance Value: 323 Ohm
Lead Channel Impedance Value: 399 Ohm
Lead Channel Impedance Value: 456 Ohm
Lead Channel Impedance Value: 456 Ohm
Lead Channel Pacing Threshold Amplitude: 0.625 V
Lead Channel Pacing Threshold Amplitude: 0.625 V
Lead Channel Pacing Threshold Pulse Width: 0.4 ms
Lead Channel Pacing Threshold Pulse Width: 0.4 ms
Lead Channel Sensing Intrinsic Amplitude: 1.875 mV
Lead Channel Sensing Intrinsic Amplitude: 1.875 mV
Lead Channel Sensing Intrinsic Amplitude: 7.5 mV
Lead Channel Sensing Intrinsic Amplitude: 7.5 mV
Lead Channel Setting Pacing Amplitude: 1.5 V
Lead Channel Setting Pacing Amplitude: 2.5 V
Lead Channel Setting Pacing Pulse Width: 0.4 ms
Lead Channel Setting Sensing Sensitivity: 2 mV

## 2020-05-29 NOTE — Progress Notes (Signed)
Remote pacemaker transmission.   

## 2020-06-22 NOTE — Progress Notes (Signed)
Patient Care Team: Adin Hector, MD as PCP - General (Internal Medicine)   HPI  Kerry Rowland is a 84 y.o. 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  Hx of atrial flutter and fib dating back about 20 yrs.  Had been treated with sotalol and without anticoagulation.  Interval development of complete heart block  Orthostatic lightheadedness improved on the new alpha-blocker regime.  Notices also that his mood is better.  No chest pain or shortness of breath or peripheral edema.  He does get tired particularly afternoons.  He is working on his airplane mostly in the mornings with school.  Also in the afternoon has had episodes where he does get suddenly quite tired.  A few hours after his midday meal which is not his biggest.  Has not checked blood pressure or heart rate.    Date Cr K Hgb Mg  5/20 0.97 4.5 13.6 2.1 (9/20)  6/21 1.2 4.2 13.6 2.1 (CE)        Past Medical History:  Diagnosis Date  . Acute idiopathic gout of right foot   . Colon polyps   . Constipation   . Erectile dysfunction   . Generalized abdominal mass   . Hyperglycemia   . Osteoarthritis   . Paroxysmal atrial flutter ([place])   . Raynaud's phenomenon   . Skin cancer     Past Surgical History:  Procedure Laterality Date  . APPENDECTOMY    . CATARACT EXTRACTION    . COLONOSCOPY WITH PROPOFOL N/A 05/22/2015   Procedure: COLONOSCOPY WITH PROPOFOL;  Surgeon: Manya Silvas, MD;  Location: [facility] ENDOSCOPY;  Service: Endoscopy;  Laterality: N/A;  . PACEMAKER IMPLANT N/A 02/25/2019   Procedure: PACEMAKER IMPLANT;  Surgeon: Deboraha Sprang, MD;  Location: [place] CV LAB;  Service: Cardiovascular;  Laterality: N/A;  . skin grafts    . TONSILLECTOMY     Allergies as of 06/23/2020      Reactions   Morphine And Related Other (See Comments)   Chills, felt cold    Nsaids Other (See Comments)   intestinal bleeding   Other    Nightshade plants - inflammation pain    Phenergan [promethazine Hcl]    Uncontrolled muscle movement       Medication List       Accurate as of June 23, 2020 10:35 AM. If you have any questions, ask your nurse or doctor.        Alpha-Lipoic Acid 300 MG Caps Take by mouth.   b complex vitamins tablet Take 1 tablet by mouth daily.   B-12 PO Take 1 tablet by mouth daily.   BLACK PEPPER-TURMERIC PO Take 1 Dose by mouth daily.   cetirizine 10 MG tablet Commonly known as: ZYRTEC Take 10 mg by mouth daily as needed for allergies.   CINNAMON PO Take 1 Dose by mouth daily.   cyclobenzaprine 10 MG tablet Commonly known as: FLEXERIL Take 10 mg by mouth 3 (three) times daily as needed for muscle spasms.   diphenhydrAMINE 25 MG tablet Commonly known as: BENADRYL Take 25 mg by mouth at bedtime as needed for sleep.   finasteride 5 MG tablet Commonly known as: PROSCAR Take 1 tablet (5 mg total) by mouth daily.   MAGNESIUM PO Take 1 tablet by mouth daily.   Multi-Minerals Tabs Take 1 tablet by mouth daily.   Multi-Vitamins Tabs Take 1 tablet by mouth daily.   NON FORMULARY flourouracil 5%+  calcipotriene 0.005% apply BID to forehead, cheeks, temple, ears, and nose   OVER THE COUNTER MEDICATION Take 1 Dose by mouth daily. Lion's mane otc supplement   OVER THE COUNTER MEDICATION Take 1 Dose by mouth daily. reishi otc supplement   PANTOTHENIC ACID PO Take 1 tablet by mouth daily.   propranolol 20 MG tablet Commonly known as: INDERAL Take 20 mg by mouth 3 (three) times daily as needed. What changed: Another medication with the same name was changed. Make sure you understand how and when to take each.   propranolol 20 MG tablet Commonly known as: INDERAL Take 1 tablet (20 mg total) by mouth 3 (three) times daily as needed. For extra beats. What changed:   reasons to take this  additional instructions   rosuvastatin 10 MG tablet Commonly known as: CRESTOR Take 1 tablet (10 mg total) by mouth  daily.   SAM-E COMPLETE PO Take 1 tablet by mouth daily.   sotalol 120 MG tablet Commonly known as: BETAPACE Take 120 mg by mouth 2 (two) times daily.   tadalafil 5 MG tablet Commonly known as: CIALIS Take 1 tablet (5 mg total) by mouth daily as needed for erectile dysfunction. What changed: Another medication with the same name was removed. Continue taking this medication, and follow the directions you see here. Changed by: [name], MD   tamsulosin 0.4 MG Caps capsule Commonly known as: FLOMAX Take 1 capsule (0.4 mg total) by mouth daily.   VITAMIN B-2 PO Take 1 tablet by mouth daily.   VITAMIN C PO Take 1 tablet by mouth daily.   VITAMIN D PO Take 1 tablet by mouth daily.   VITAMIN E PO Take 1 capsule by mouth daily.   ZINC PO Take 1 tablet by mouth daily.        Allergies  Allergen Reactions  . Morphine And Related Other (See Comments)    Chills, felt cold   . Nsaids Other (See Comments)    intestinal bleeding  . Other     Nightshade plants - inflammation pain  . Phenergan [Promethazine Hcl]     Uncontrolled muscle movement       Review of Systems negative except from HPI and PMH  Physical Exam BP 140/70 (BP Location: Left Arm, Patient Position: Sitting, Cuff Size: Normal)   Pulse 64   Ht 6\' 1"  (1.854 m)   Wt 169 lb 2 oz (76.7 kg)   SpO2 96%   BMI 22.31 kg/m  Well developed and well nourished in no acute distress HENT normal Neck supple with JVP-flat Clear Device pocket well healed; without hematoma or erythema.  There is no tethering  Regular rate and rhythm, no  gallop No  murmur Abd-soft with active BS No Clubbing cyanosis   edema Skin-warm and dry A & Oriented  Grossly normal sensory and motor function  ECG AV pacing  Device interrogation demonstrates 0.5% atrial fibrillation Assessment and  Plan  Atrial flutter/fibrillation   Sinus bradycardia/first-degree AV block  Pacemaker Medtronic  AV block complete  Orthostatic  lightheadedness  High Risk Medication Surveillance sotalol  Hyperlipidemia   Complete heart block.  No impact thus far on exercise capacity.  No significant interval atrial fibrillation.  Remains off of anticoagulation per his choice.  Has episodes in the afternoon where he gets quite tired.  Suggested he check blood pressure and heart rate.  Not related to his meals.  No associated palpitations.  Orthostasis (and mood) are much improved on his new  alpha-blocker regime  Blood work surveillance on sotalol is within range.  QT interval is within range.   Current medicines are reviewed at length with the patient today .  The patient does not  have concerns regarding medicines.

## 2020-06-23 ENCOUNTER — Ambulatory Visit: Payer: Medicare Other | Admitting: Internal Medicine

## 2020-06-23 ENCOUNTER — Other Ambulatory Visit: Payer: Self-pay

## 2020-06-23 ENCOUNTER — Encounter: Payer: Self-pay | Admitting: Internal Medicine

## 2020-06-23 VITALS — BP 140/70 | HR 64 | Ht 73.0 in | Wt 169.1 lb

## 2020-06-23 DIAGNOSIS — I4892 Unspecified atrial flutter: Secondary | ICD-10-CM

## 2020-06-23 DIAGNOSIS — I495 Sick sinus syndrome: Secondary | ICD-10-CM | POA: Diagnosis not present

## 2020-06-23 DIAGNOSIS — I441 Atrioventricular block, second degree: Secondary | ICD-10-CM

## 2020-06-23 DIAGNOSIS — Z950 Presence of cardiac pacemaker: Secondary | ICD-10-CM | POA: Diagnosis not present

## 2020-06-23 LAB — CUP PACEART INCLINIC DEVICE CHECK
Battery Remaining Longevity: 120 mo
Battery Voltage: 3.01 V
Brady Statistic AP VP Percent: 98.88 %
Brady Statistic AP VS Percent: 0.15 %
Brady Statistic AS VP Percent: 0.92 %
Brady Statistic AS VS Percent: 0.05 %
Brady Statistic RA Percent Paced: 98.54 %
Brady Statistic RV Percent Paced: 99.8 %
Date Time Interrogation Session: 20210928093916
Implantable Lead Implant Date: 20200601
Implantable Lead Implant Date: 20200601
Implantable Lead Location: 753859
Implantable Lead Location: 753860
Implantable Lead Model: 5076
Implantable Lead Model: 5076
Implantable Pulse Generator Implant Date: 20200601
Lead Channel Impedance Value: 323 Ohm
Lead Channel Impedance Value: 380 Ohm
Lead Channel Impedance Value: 456 Ohm
Lead Channel Impedance Value: 494 Ohm
Lead Channel Pacing Threshold Amplitude: 0.625 V
Lead Channel Pacing Threshold Amplitude: 0.75 V
Lead Channel Pacing Threshold Pulse Width: 0.4 ms
Lead Channel Pacing Threshold Pulse Width: 0.4 ms
Lead Channel Sensing Intrinsic Amplitude: 1.75 mV
Lead Channel Sensing Intrinsic Amplitude: 2.125 mV
Lead Channel Sensing Intrinsic Amplitude: 7.125 mV
Lead Channel Sensing Intrinsic Amplitude: 7.125 mV
Lead Channel Setting Pacing Amplitude: 1.5 V
Lead Channel Setting Pacing Amplitude: 2.5 V
Lead Channel Setting Pacing Pulse Width: 0.4 ms
Lead Channel Setting Sensing Sensitivity: 2 mV

## 2020-06-23 LAB — PACEMAKER DEVICE OBSERVATION

## 2020-06-23 NOTE — Patient Instructions (Signed)
Medication Instructions:  - Your physician recommends that you continue on your current medications as directed. Please refer to the Current Medication list given to you today.  *If you need a refill on your cardiac medications before your next appointment, please call your pharmacy*   Lab Work: - none ordered  If you have labs (blood work) drawn today and your tests are completely normal, you will receive your results only by: Marland Kitchen MyChart Message (if you have MyChart) OR . A paper copy in the mail If you have any lab test that is abnormal or we need to change your treatment, we will call you to review the results.   Testing/Procedures: - none ordered   Follow-Up: At [facility], you and your health needs are our priority.  As part of our continuing mission to provide you with exceptional heart care, we have created designated Provider Care Teams.  These Care Teams include your primary Cardiologist (physician) and Advanced Practice Providers (APPs -  Physician Assistants and Nurse Practitioners) who all work together to provide you with the care you need, when you need it.  We recommend signing up for the patient portal called "MyChart".  Sign up information is provided on this After Visit Summary.  MyChart is used to connect with patients for Virtual Visits (Telemedicine).  Patients are able to view lab/test results, encounter notes, upcoming appointments, etc.  Non-urgent messages can be sent to your provider as well.   To learn more about what you can do with MyChart, go to [email or web address].    Your next appointment:   June 2022   The format for your next appointment:   In Person  Provider:   [name], MD   Other Instructions n/a

## 2020-07-02 ENCOUNTER — Other Ambulatory Visit: Payer: Self-pay

## 2020-07-02 MED ORDER — TADALAFIL 5 MG PO TABS: 5.0000 mg | ORAL_TABLET | Freq: Every day | ORAL | 2 refills | Status: DC | PRN

## 2020-08-26 ENCOUNTER — Ambulatory Visit (INDEPENDENT_AMBULATORY_CARE_PROVIDER_SITE_OTHER): Payer: Medicare Other

## 2020-08-26 DIAGNOSIS — I441 Atrioventricular block, second degree: Secondary | ICD-10-CM

## 2020-08-26 LAB — CUP PACEART REMOTE DEVICE CHECK
Battery Remaining Longevity: 115 mo
Battery Voltage: 3.01 V
Brady Statistic AP VP Percent: 98.68 %
Brady Statistic AP VS Percent: 0.02 %
Brady Statistic AS VP Percent: 1.3 %
Brady Statistic AS VS Percent: 0.01 %
Brady Statistic RA Percent Paced: 98.68 %
Brady Statistic RV Percent Paced: 99.97 %
Date Time Interrogation Session: 20211130222712
Implantable Lead Implant Date: 20200601
Implantable Lead Implant Date: 20200601
Implantable Lead Location: 753859
Implantable Lead Location: 753860
Implantable Lead Model: 5076
Implantable Lead Model: 5076
Implantable Pulse Generator Implant Date: 20200601
Lead Channel Impedance Value: 285 Ohm
Lead Channel Impedance Value: 361 Ohm
Lead Channel Impedance Value: 418 Ohm
Lead Channel Impedance Value: 437 Ohm
Lead Channel Pacing Threshold Amplitude: 0.625 V
Lead Channel Pacing Threshold Amplitude: 0.625 V
Lead Channel Pacing Threshold Pulse Width: 0.4 ms
Lead Channel Pacing Threshold Pulse Width: 0.4 ms
Lead Channel Sensing Intrinsic Amplitude: 1.75 mV
Lead Channel Sensing Intrinsic Amplitude: 1.75 mV
Lead Channel Sensing Intrinsic Amplitude: 7.25 mV
Lead Channel Sensing Intrinsic Amplitude: 7.25 mV
Lead Channel Setting Pacing Amplitude: 1.5 V
Lead Channel Setting Pacing Amplitude: 2.5 V
Lead Channel Setting Pacing Pulse Width: 0.4 ms
Lead Channel Setting Sensing Sensitivity: 2 mV

## 2020-09-02 NOTE — Progress Notes (Signed)
Remote pacemaker transmission.   

## 2020-09-04 IMAGING — DX CHEST - 2 VIEW
2 series · 2 of 2 positions shown · non-contrast
Comparison: None.

CLINICAL DATA: Status post pacemaker placement

EXAM:
CHEST - 2 VIEW

[chest pa]
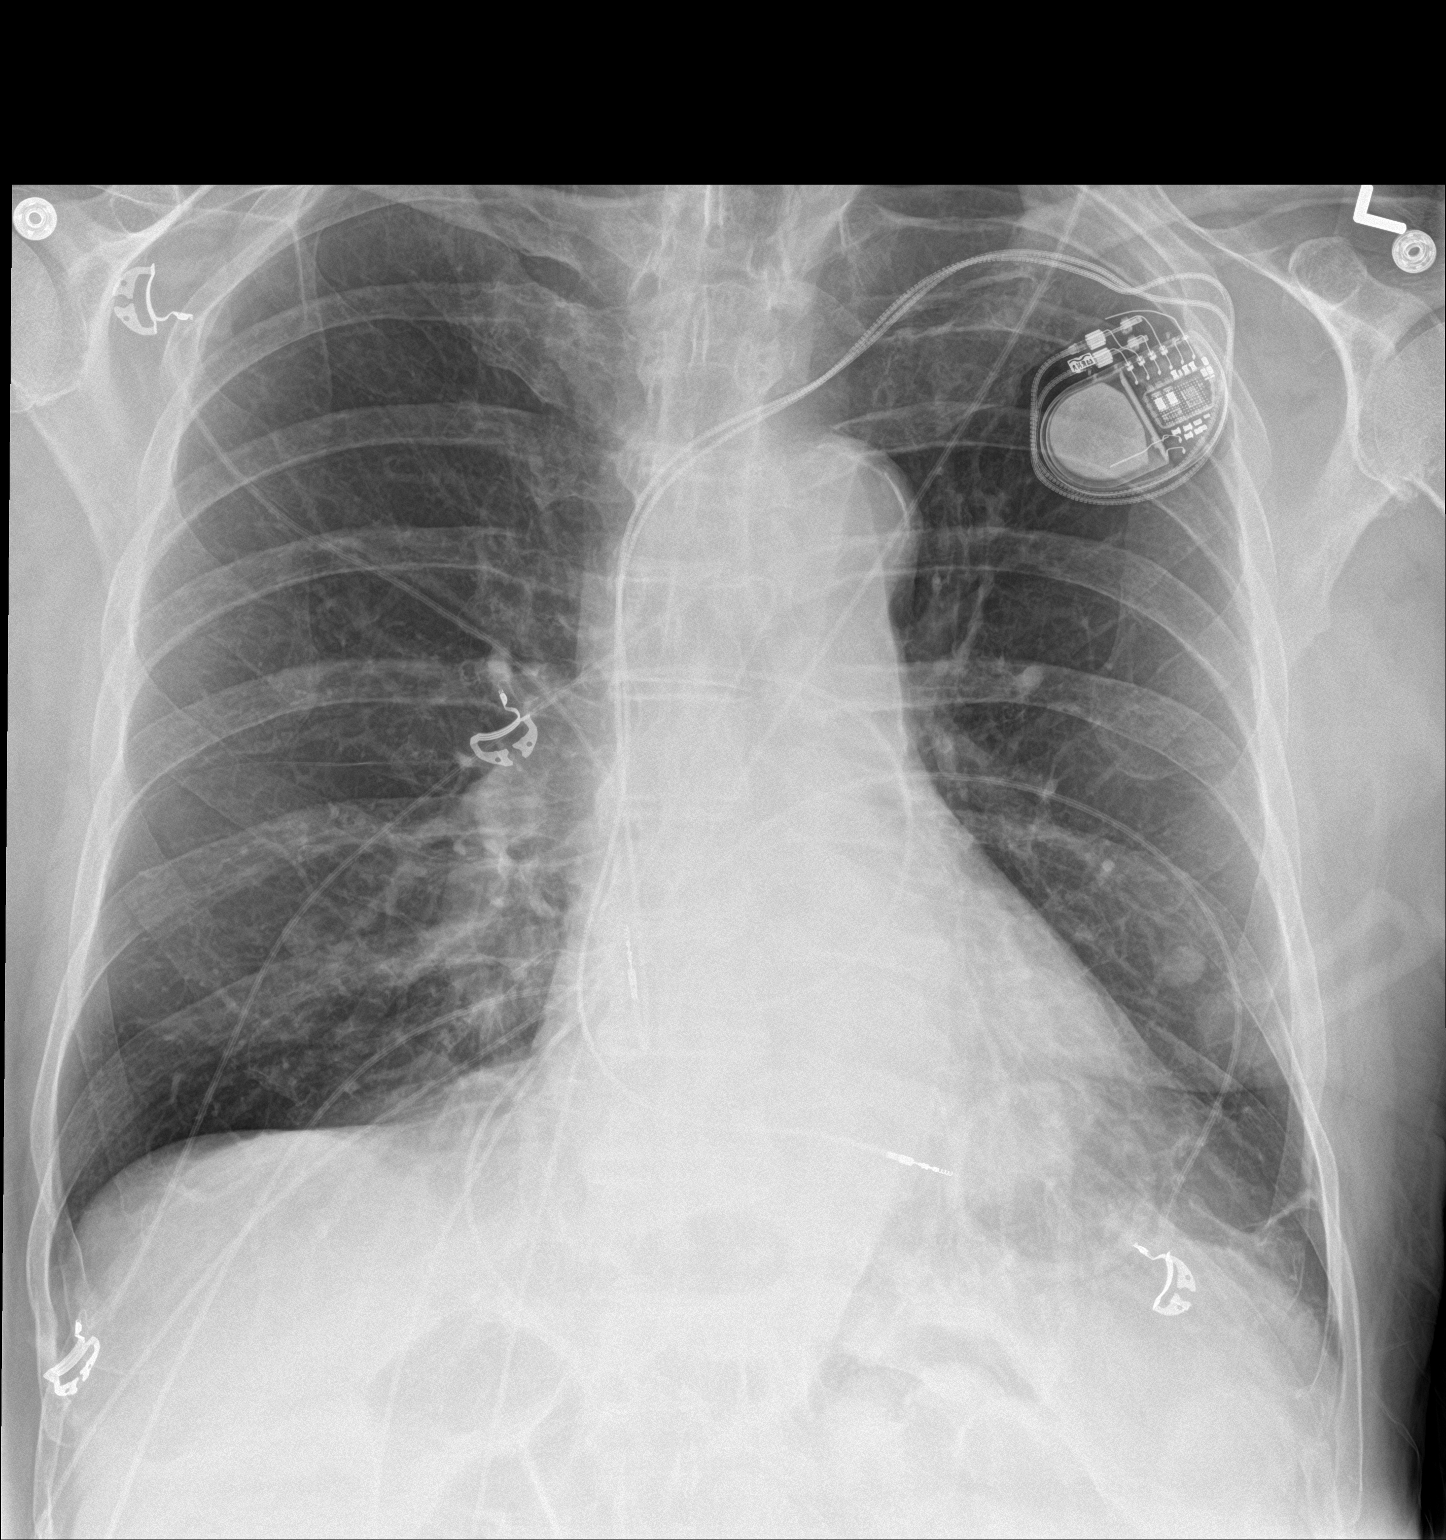

[chest lat]
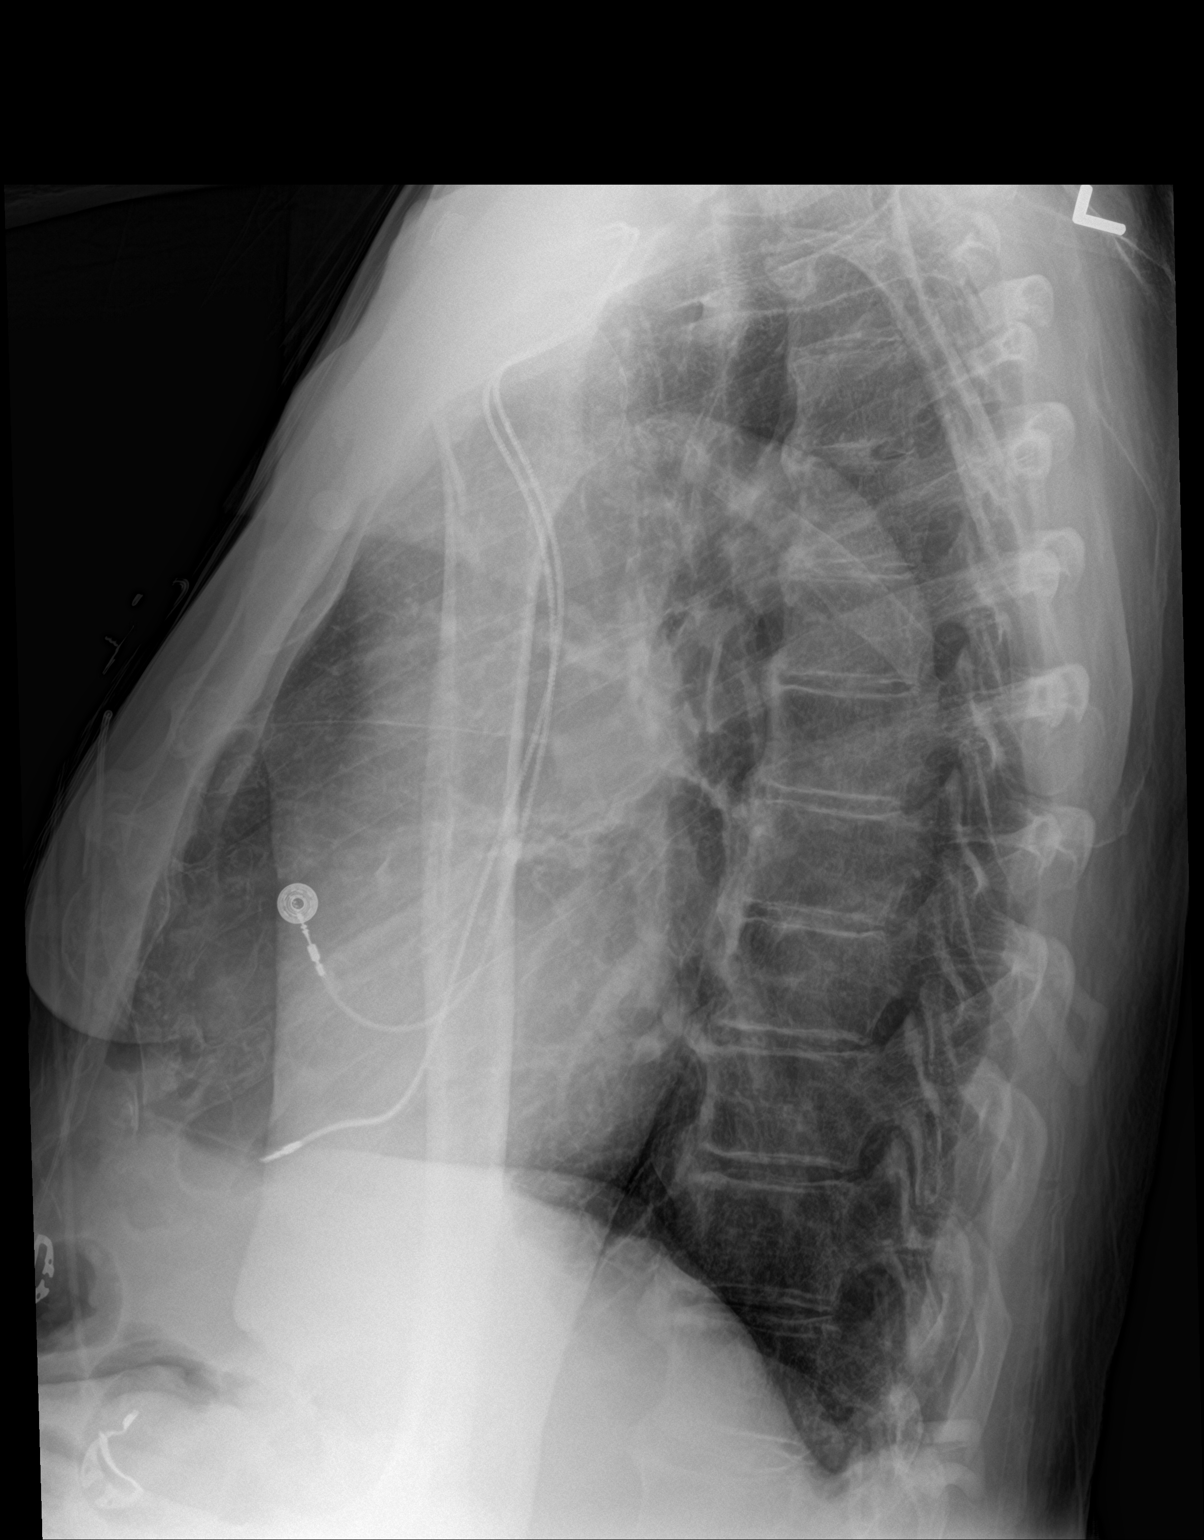

[2 of 2 positions shown; findings below may reference images not displayed]

FINDINGS: Pacing device is seen in satisfactory position. No cardiac
enlargement is noted. Aortic calcifications are seen. Bilateral
nipple shadows are noted. The lungs are well aerated bilaterally
without focal infiltrate or sizable effusion. No acute bony
abnormality is noted.
IMPRESSION: No evidence of pneumothorax following pacer placement.

## 2020-09-17 ENCOUNTER — Other Ambulatory Visit: Payer: Self-pay | Admitting: Urology

## 2020-09-21 ENCOUNTER — Telehealth: Payer: Self-pay | Admitting: Internal Medicine

## 2020-09-21 MED ORDER — ROSUVASTATIN CALCIUM 10 MG PO TABS: 10.0000 mg | ORAL_TABLET | Freq: Every day | ORAL | 3 refills | Status: DC

## 2020-09-21 NOTE — Telephone Encounter (Signed)
Received fax from [facility] requesting refills for Rosuvastatin 10 mg QD. Rx request sent to pharmacy.

## 2020-10-10 IMAGING — CT CT HEART SCORING
2 series · 16 of 20 positions shown, 18 images · non-contrast
Comparison: 02/26/2019 chest radiograph.  No prior CT.

Addendum:
CLINICAL DATA: Risk stratification

EXAM:
Coronary Calcium Score
TECHNIQUE: The patient was scanned on a Siemens Force scanner. Axial
non-contrast 3 mm slices were carried out through the heart. The
data set was analyzed on a dedicated work station and scored using
the Agatson method.
MEDICATIONS:
None

[Series 2: casc 3.0 i36f 2 bestdiast 68 % · axial · 0.42mm/px · z∈[-235,-130]mm · 8 of 47 slices shown, 10 images]
[im 6/47  vessel]
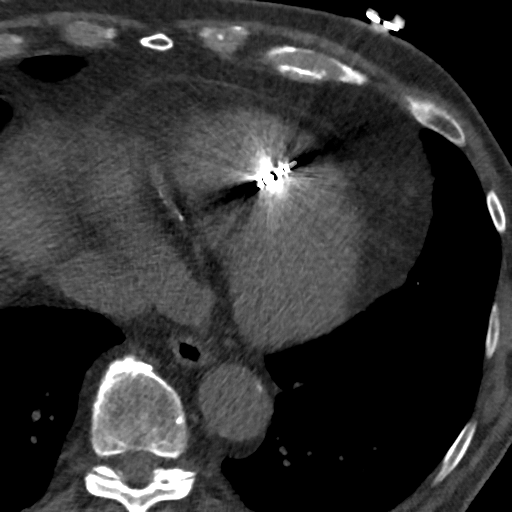
[im 6/47  lung]
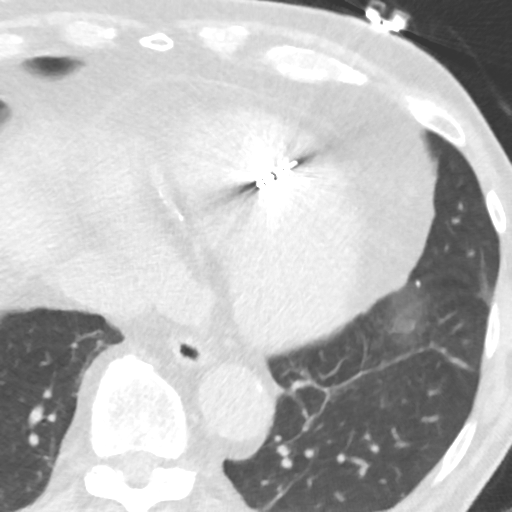
[im 11/47  vessel]
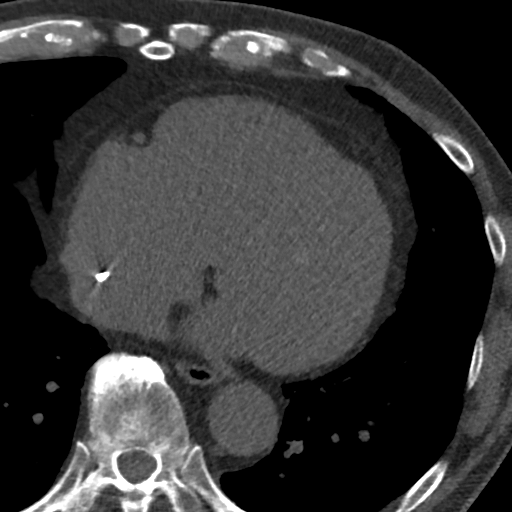
[im 16/47  vessel]
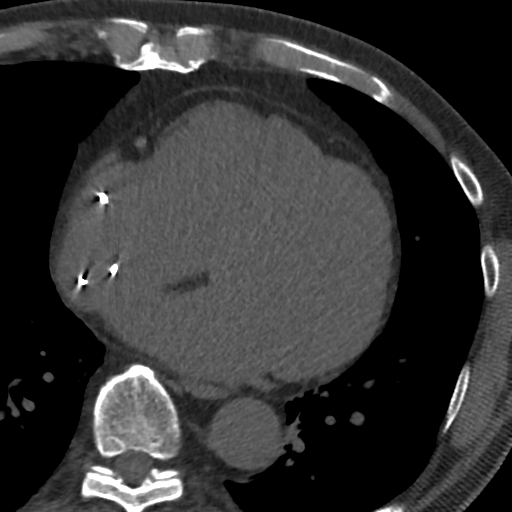
[im 21/47  vessel]
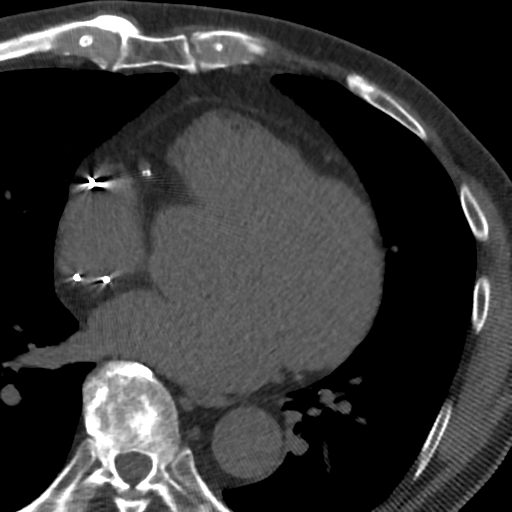
[im 26/47  vessel]
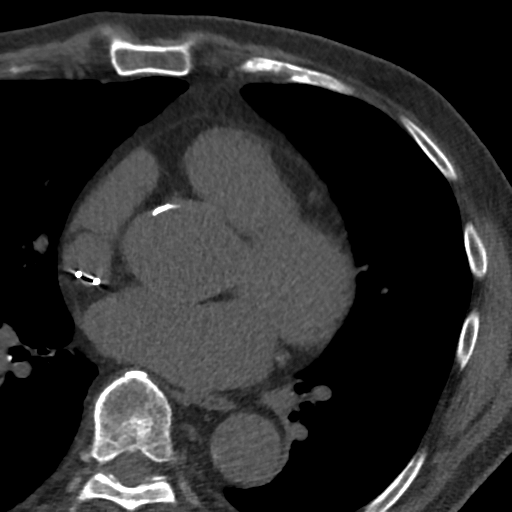
[im 26/47  lung]
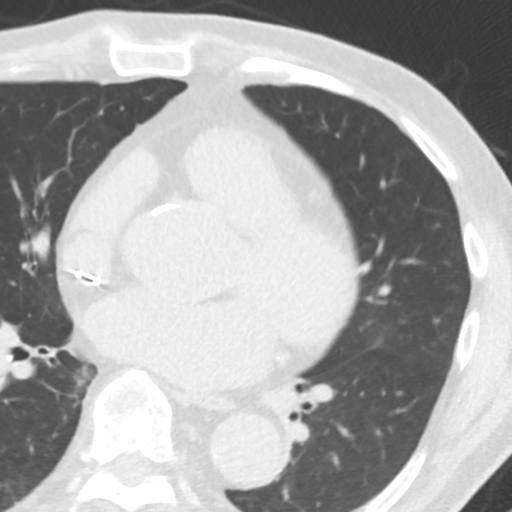
[im 31/47  vessel]
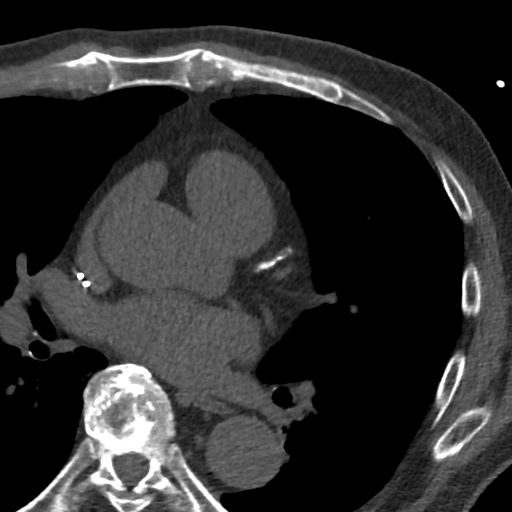
[im 36/47  vessel]
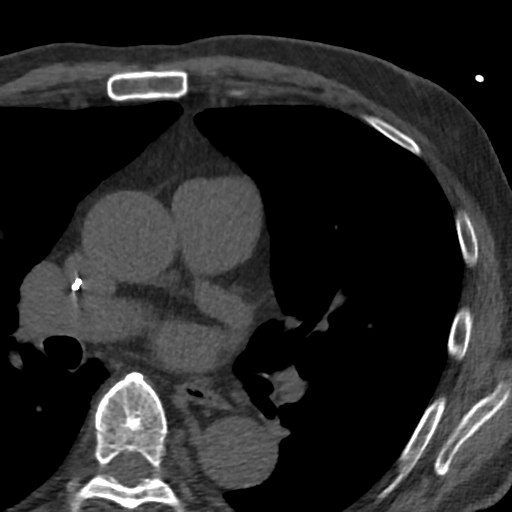
[im 41/47  vessel]
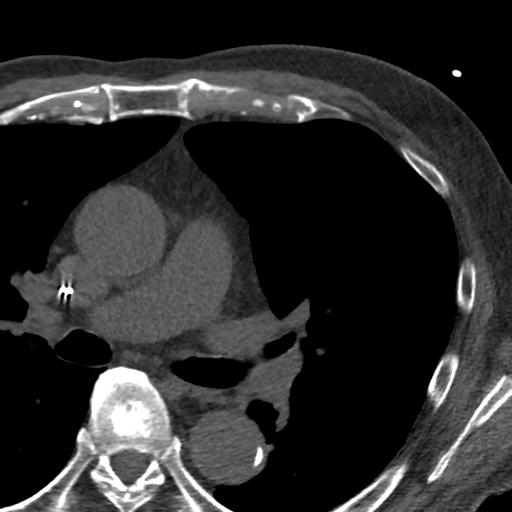

[Series 4: lung st 68 % · axial · 0.68mm/px · z∈[-235,-130]mm · 8 of 47 slices shown]
[im 6/47  lung]
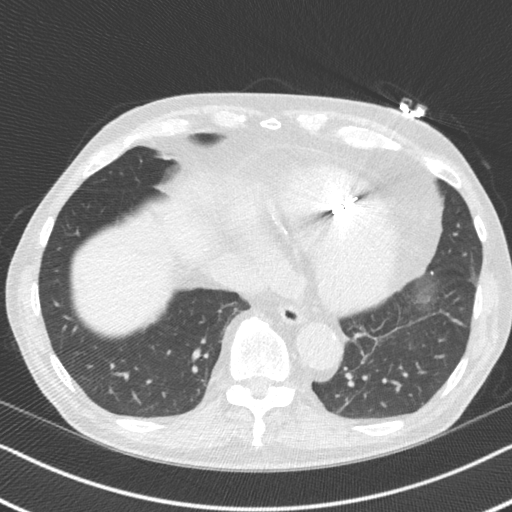
[im 11/47  lung]
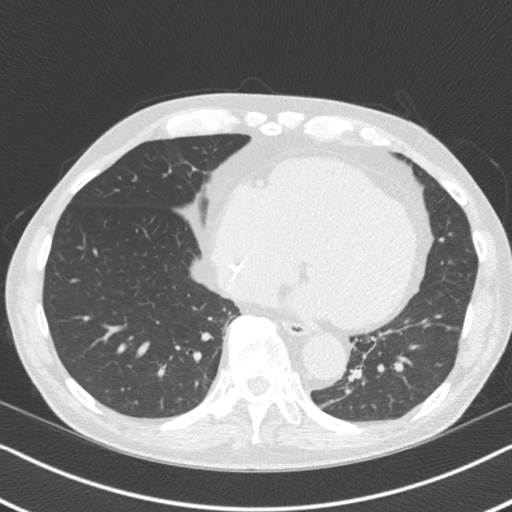
[im 16/47  lung]
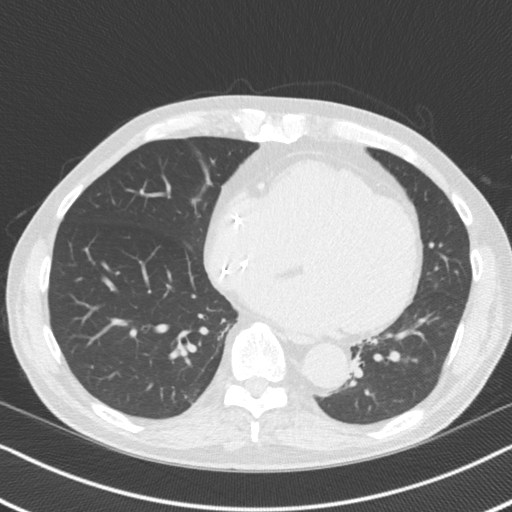
[im 21/47  lung]
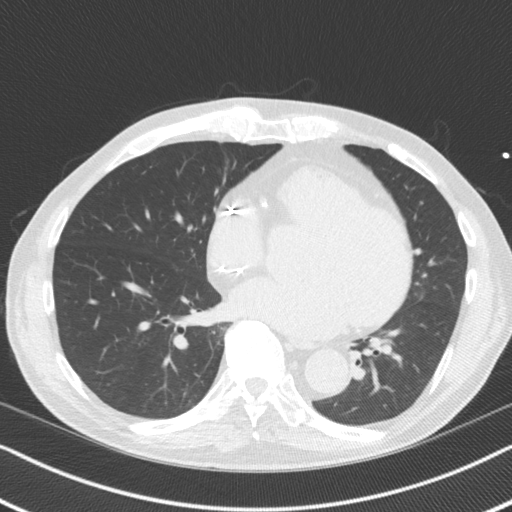
[im 26/47  lung]
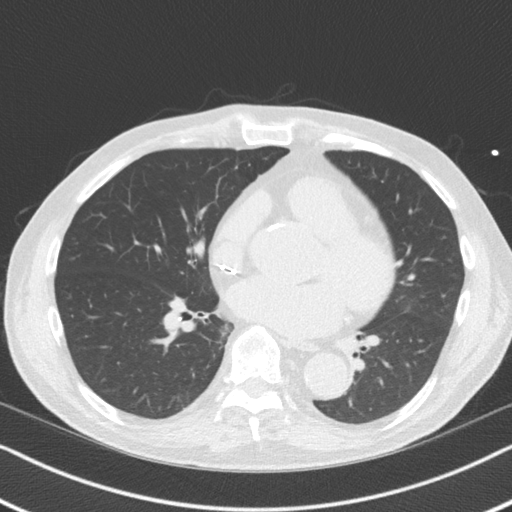
[im 31/47  lung]
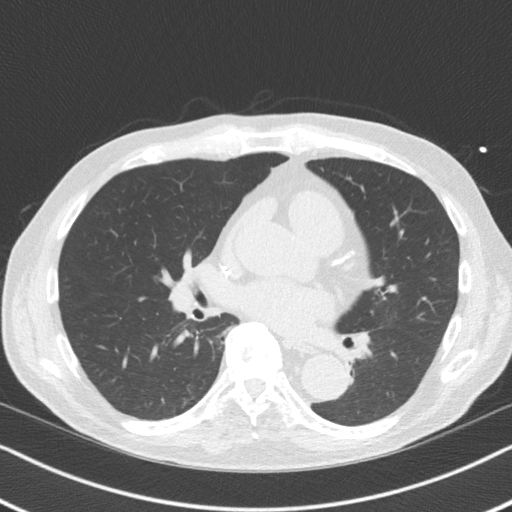
[im 36/47  lung]
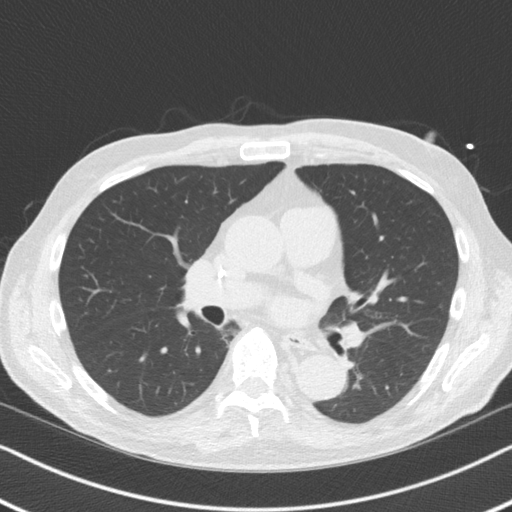
[im 41/47  lung]
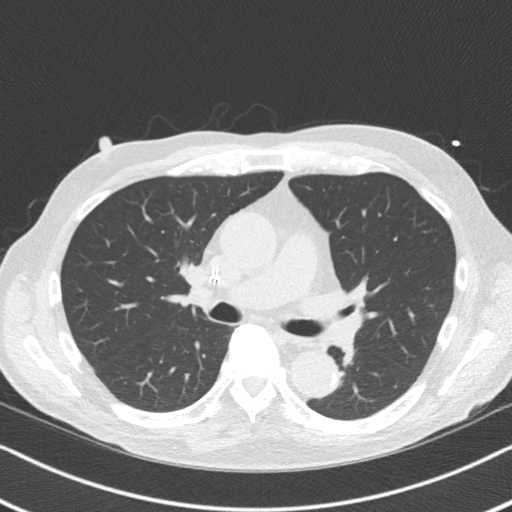

[16 of 20 positions shown; findings below may reference images not displayed]

FINDINGS: Non-cardiac: See separate report from [REDACTED].

Ascending Aorta: Normal size, mild diffuse calcifications.

Pericardium: Normal.

Coronary arteries: Normal origin.
IMPRESSION: Coronary calcium score of 752. This was 62 percentile for age and
sex matched control.

ADDENDUM:
OVER-READ INTERPRETATION  CT CHEST

The following report is an over-read performed by radiologist Dr.
does not include interpretation of cardiac or coronary anatomy or
pathology. The cardiac score interpretation by the cardiologist is
attached.
FINDINGS: Vascular: Aortic atherosclerosis.

Mediastinum/Nodes: No imaged thoracic adenopathy.

Lungs/Pleura: No imaged pleural fluid. Left lower lobe scarring. A
right middle lobe pulmonary nodule measures 4 mm on [DATE].

Upper Abdomen: Normal imaged portions of the liver, spleen, stomach.

Musculoskeletal: Moderate hepatic steatosis.
IMPRESSION: 1.  No acute findings in the imaged extracardiac chest.
2.  Aortic Atherosclerosis ([id number]).
3. Right middle lobe 4 mm pulmonary nodule. No follow-up needed if
patient is low-risk. Non-contrast chest CT can be considered in 12
months if patient is high-risk. This recommendation follows the
consensus statement: Guidelines for Management of Incidental
Pulmonary Nodules Detected on CT Images: From the [HOSPITAL]

*** End of Addendum ***
FINDINGS: Non-cardiac: See separate report from [REDACTED].

Ascending Aorta: Normal size, mild diffuse calcifications.

Pericardium: Normal.

Coronary arteries: Normal origin.
IMPRESSION: Coronary calcium score of 752. This was 62 percentile for age and
sex matched control.

## 2020-11-25 ENCOUNTER — Ambulatory Visit (INDEPENDENT_AMBULATORY_CARE_PROVIDER_SITE_OTHER): Payer: Medicare Other

## 2020-11-25 DIAGNOSIS — I441 Atrioventricular block, second degree: Secondary | ICD-10-CM | POA: Diagnosis not present

## 2020-11-27 LAB — CUP PACEART REMOTE DEVICE CHECK
Battery Remaining Longevity: 110 mo
Battery Voltage: 3 V
Brady Statistic AP VP Percent: 98.11 %
Brady Statistic AP VS Percent: 0 %
Brady Statistic AS VP Percent: 1.89 %
Brady Statistic AS VS Percent: 0 %
Brady Statistic RA Percent Paced: 98.1 %
Brady Statistic RV Percent Paced: 100 %
Date Time Interrogation Session: 20220302125820
Implantable Lead Implant Date: 20200601
Implantable Lead Implant Date: 20200601
Implantable Lead Location: 753859
Implantable Lead Location: 753860
Implantable Lead Model: 5076
Implantable Lead Model: 5076
Implantable Pulse Generator Implant Date: 20200601
Lead Channel Impedance Value: 266 Ohm
Lead Channel Impedance Value: 342 Ohm
Lead Channel Impedance Value: 399 Ohm
Lead Channel Impedance Value: 418 Ohm
Lead Channel Pacing Threshold Amplitude: 0.625 V
Lead Channel Pacing Threshold Amplitude: 0.75 V
Lead Channel Pacing Threshold Pulse Width: 0.4 ms
Lead Channel Pacing Threshold Pulse Width: 0.4 ms
Lead Channel Sensing Intrinsic Amplitude: 1.75 mV
Lead Channel Sensing Intrinsic Amplitude: 1.75 mV
Lead Channel Sensing Intrinsic Amplitude: 7.25 mV
Lead Channel Sensing Intrinsic Amplitude: 7.25 mV
Lead Channel Setting Pacing Amplitude: 1.5 V
Lead Channel Setting Pacing Amplitude: 2.5 V
Lead Channel Setting Pacing Pulse Width: 0.4 ms
Lead Channel Setting Sensing Sensitivity: 2 mV

## 2020-12-04 NOTE — Progress Notes (Signed)
Remote pacemaker transmission.   

## 2021-02-24 ENCOUNTER — Ambulatory Visit (INDEPENDENT_AMBULATORY_CARE_PROVIDER_SITE_OTHER): Payer: Medicare Other

## 2021-02-24 DIAGNOSIS — I441 Atrioventricular block, second degree: Secondary | ICD-10-CM | POA: Diagnosis not present

## 2021-02-25 LAB — CUP PACEART REMOTE DEVICE CHECK
Battery Remaining Longevity: 106 mo
Battery Voltage: 3 V
Brady Statistic AP VP Percent: 98.01 %
Brady Statistic AP VS Percent: 0.09 %
Brady Statistic AS VP Percent: 1.88 %
Brady Statistic AS VS Percent: 0.02 %
Brady Statistic RA Percent Paced: 98.08 %
Brady Statistic RV Percent Paced: 99.89 %
Date Time Interrogation Session: 20220531211059
Implantable Lead Implant Date: 20200601
Implantable Lead Implant Date: 20200601
Implantable Lead Location: 753859
Implantable Lead Location: 753860
Implantable Lead Model: 5076
Implantable Lead Model: 5076
Implantable Pulse Generator Implant Date: 20200601
Lead Channel Impedance Value: 285 Ohm
Lead Channel Impedance Value: 361 Ohm
Lead Channel Impedance Value: 399 Ohm
Lead Channel Impedance Value: 418 Ohm
Lead Channel Pacing Threshold Amplitude: 0.625 V
Lead Channel Pacing Threshold Amplitude: 0.75 V
Lead Channel Pacing Threshold Pulse Width: 0.4 ms
Lead Channel Pacing Threshold Pulse Width: 0.4 ms
Lead Channel Sensing Intrinsic Amplitude: 1.625 mV
Lead Channel Sensing Intrinsic Amplitude: 1.625 mV
Lead Channel Sensing Intrinsic Amplitude: 6.5 mV
Lead Channel Sensing Intrinsic Amplitude: 6.5 mV
Lead Channel Setting Pacing Amplitude: 1.5 V
Lead Channel Setting Pacing Amplitude: 2.5 V
Lead Channel Setting Pacing Pulse Width: 0.4 ms
Lead Channel Setting Sensing Sensitivity: 2 mV

## 2021-02-26 ENCOUNTER — Ambulatory Visit: Payer: Medicare Other | Admitting: Cardiovascular Disease

## 2021-03-02 ENCOUNTER — Ambulatory Visit: Payer: Medicare Other | Admitting: Nurse Practitioner

## 2021-03-11 ENCOUNTER — Other Ambulatory Visit: Payer: Self-pay | Admitting: Internal Medicine

## 2021-03-11 DIAGNOSIS — E7800 Pure hypercholesterolemia, unspecified: Secondary | ICD-10-CM

## 2021-03-11 DIAGNOSIS — R7989 Other specified abnormal findings of blood chemistry: Secondary | ICD-10-CM

## 2021-03-19 NOTE — Progress Notes (Signed)
Remote pacemaker transmission.   

## 2021-03-24 ENCOUNTER — Other Ambulatory Visit: Payer: Self-pay

## 2021-03-24 ENCOUNTER — Encounter: Payer: Self-pay | Admitting: Cardiovascular Disease

## 2021-03-24 ENCOUNTER — Ambulatory Visit (INDEPENDENT_AMBULATORY_CARE_PROVIDER_SITE_OTHER): Payer: Medicare Other | Admitting: Cardiovascular Disease

## 2021-03-24 VITALS — BP 140/82 | HR 63 | Ht 73.0 in | Wt 175.1 lb

## 2021-03-24 DIAGNOSIS — E782 Mixed hyperlipidemia: Secondary | ICD-10-CM | POA: Diagnosis not present

## 2021-03-24 DIAGNOSIS — Z950 Presence of cardiac pacemaker: Secondary | ICD-10-CM | POA: Diagnosis not present

## 2021-03-24 DIAGNOSIS — I441 Atrioventricular block, second degree: Secondary | ICD-10-CM

## 2021-03-24 DIAGNOSIS — I4892 Unspecified atrial flutter: Secondary | ICD-10-CM

## 2021-03-24 NOTE — Patient Instructions (Signed)
Medication Instructions:  No changes  If you need a refill on your cardiac medications before your next appointment, please call your pharmacy.    Lab work: No new labs needed   If you have labs (blood work) drawn today and your tests are completely normal, you will receive your results only by: . MyChart Message (if you have MyChart) OR . A paper copy in the mail If you have any lab test that is abnormal or we need to change your treatment, we will call you to review the results.   Testing/Procedures: No new testing needed   Follow-Up: At [facility], you and your health needs are our priority.  As part of our continuing mission to provide you with exceptional heart care, we have created designated Provider Care Teams.  These Care Teams include your primary Cardiologist (physician) and Advanced Practice Providers (APPs -  Physician Assistants and Nurse Practitioners) who all work together to provide you with the care you need, when you need it.  . You will need a follow up appointment in 12 months  . Providers on your designated Care Team:   . Christopher Berge, NP . Ryan Dunn, PA-C . Jacquelyn Visser, PA-C  Any Other Special Instructions Will Be Listed Below (If Applicable).  COVID-19 Vaccine Information can be found at: [email or web address] For questions related to vaccine distribution or appointments, please email [email or web address] or call [phone].     

## 2021-03-24 NOTE — Progress Notes (Signed)
Cardiology Office Note  Date:  03/24/2021   ID:  Kerry Rowland, DOB 01-May-1936, MRN [id number]  PCP:  Kerry Hector, MD   Chief Complaint  Patient presents with   Follow-up    Patient c/o decreased blood pressure, feeling tired and fatigue. Medications reviewed by the patient verbally.     HPI:  Kerry Rowland is a 85 year old gentleman with past medical history of hyperglycemia,  hyperlipidemia,  history of atrial flutter,  osteoarthritis,  Gout Former smoker Who presents for follow-up of his paroxysmal atrial flutter, bradycardia, palpitations  Last seen in clinic by myself April 2020 Seen by EP September 2021  Some disrupted sleep, has to take naps No exercise  BP at home <120, sometimes <009 systolic No orthostasis On sotalol, Flomax, Cialis  history of paroxysmal atrial flutter,  he spends part of the year in [place]. Recently built in airplane, likes to fly  Also plays golf for exercise Denies chest pain concerning for angina  EKG personally reviewed by myself on todays visit NSR rate 63 bpm paced  Other past medical history reviewed Lab work reviewed total chol 208 To Crestor, total cholesterol down to 150 Former smoker 20 years   PMH:   has a past medical history of Acute idiopathic gout of right foot, Colon polyps, Constipation, Erectile dysfunction, Generalized abdominal mass, Hyperglycemia, Osteoarthritis, Paroxysmal atrial flutter ([place]), Raynaud's phenomenon, and Skin cancer.  PSH:    Past Surgical History:  Procedure Laterality Date   APPENDECTOMY     CATARACT EXTRACTION     COLONOSCOPY WITH PROPOFOL N/A 05/22/2015   Procedure: COLONOSCOPY WITH PROPOFOL;  Surgeon: Kerry Silvas, MD;  Location: [facility] ENDOSCOPY;  Service: Endoscopy;  Laterality: N/A;   PACEMAKER IMPLANT N/A 02/25/2019   Procedure: PACEMAKER IMPLANT;  Surgeon: Kerry Sprang, MD;  Location: [place] [facility] LAB;  Service: Cardiovascular;  Laterality: N/A;   skin grafts      TONSILLECTOMY      Current Outpatient Medications  Medication Sig Dispense Refill   Alpha-Lipoic Acid 300 MG CAPS Take by mouth.     Ascorbic Acid (VITAMIN C PO) Take 1 tablet by mouth daily.     b complex vitamins tablet Take 1 tablet by mouth daily.     BLACK PEPPER-TURMERIC PO Take 1 Dose by mouth daily.     cetirizine (ZYRTEC) 10 MG tablet Take 10 mg by mouth daily as needed for allergies.     CINNAMON PO Take 1 Dose by mouth daily.     Cyanocobalamin (B-12 PO) Take 1 tablet by mouth daily.     cyclobenzaprine (FLEXERIL) 10 MG tablet Take 10 mg by mouth 3 (three) times daily as needed for muscle spasms.     diphenhydrAMINE (BENADRYL) 25 MG tablet Take 25 mg by mouth at bedtime as needed for sleep.     finasteride (PROSCAR) 5 MG tablet TAKE 1 TABLET BY MOUTH  DAILY 90 tablet 3   MAGNESIUM PO Take 1 tablet by mouth daily.     Multiple Minerals (MULTI-MINERALS) TABS Take 1 tablet by mouth daily.     Multiple Vitamins-Minerals (ZINC PO) Take 1 tablet by mouth daily.     OVER THE COUNTER MEDICATION Take 1 Dose by mouth daily. Lion's mane otc supplement     OVER THE COUNTER MEDICATION Take 1 Dose by mouth daily. reishi otc supplement     PANTOTHENIC ACID PO Take 1 tablet by mouth daily.     Riboflavin (VITAMIN B-2 PO) Take  1 tablet by mouth daily.     rosuvastatin (CRESTOR) 10 MG tablet Take 1 tablet (10 mg total) by mouth daily. 90 tablet 3   S-Adenosylmethionine (SAM-E COMPLETE PO) Take 1 tablet by mouth daily.     sotalol (BETAPACE) 120 MG tablet Take 120 mg by mouth 2 (two) times daily.     tadalafil (CIALIS) 5 MG tablet Take 1 tablet (5 mg total) by mouth daily as needed for erectile dysfunction. 90 tablet 2   tamsulosin (FLOMAX) 0.4 MG CAPS capsule Take 1 capsule (0.4 mg total) by mouth daily. 90 capsule 3   triamcinolone cream (KENALOG) 0.1 % APPLY TO THE AFFECTED AREA ON ARMS TWICE DAILY UNTIL CLEAR     VITAMIN D PO Take 1 tablet by mouth daily.     VITAMIN E PO Take 1 capsule by  mouth daily.     No current facility-administered medications for this visit.     Allergies:   Morphine and related, Nsaids, Other, and Phenergan [promethazine hcl]   Social History:  The patient  reports that he has quit smoking. His smoking use included cigarettes. He has never used smokeless tobacco. He reports current alcohol use. He reports that he does not use drugs.   Family History:   family history is not on file.    Review of Systems: Review of Systems  Constitutional: Negative.   Respiratory: Negative.    Cardiovascular:  Positive for palpitations.  Gastrointestinal: Negative.   Musculoskeletal: Negative.   Neurological: Negative.   Psychiatric/Behavioral: Negative.    All other systems reviewed and are negative.  PHYSICAL EXAM: VS:  BP 140/82 (BP Location: Left Arm, Patient Position: Sitting, Cuff Size: Normal)   Pulse 63   Ht 6\' 1"  (1.854 m)   Wt 175 lb 2 oz (79.4 kg)   SpO2 98%   BMI 23.10 kg/m  , BMI Body mass index is 23.1 kg/m. Constitutional:  oriented to person, place, and time. No distress.  HENT:  Head: Grossly normal Eyes:  no discharge. No scleral icterus.  Neck: No JVD, no carotid bruits  Cardiovascular: Regular rate and rhythm, no murmurs appreciated Pulmonary/Chest: Clear to auscultation bilaterally, no wheezes or rails Abdominal: Soft.  no distension.  no tenderness.  Musculoskeletal: Normal range of motion Neurological:  normal muscle tone. Coordination normal. No atrophy Skin: Skin warm and dry Psychiatric: normal affect, pleasant  Recent Labs: No results found for requested labs within last 8760 hours.    Lipid Panel Lab Results  Component Value Date   CHOL 126 05/30/2019   HDL 44 05/30/2019   LDLCALC 51 05/30/2019   TRIG 154 (H) 05/30/2019      Wt Readings from Last 3 Encounters:  03/24/21 175 lb 2 oz (79.4 kg)  06/23/20 169 lb 2 oz (76.7 kg)  04/23/20 169 lb 8 oz (76.9 kg)      ASSESSMENT AND PLAN:  Paroxysmal atrial  flutter (HCC) -  In general is asymptomatic from his flutter On sotolol 120 twice daily  Family history of early CAD -  Discussed CT coronary calcium scoring for risk stratification On crestor  Mixed hyperlipidemia Numbers good, stay on crestor 10  Essential hypertension Blood pressure is well controlled on today's visit. No changes made to the medications.  Elevated glucose We have encouraged continued exercise, careful diet management in an effort to lose weight.  Former smoker Likely with mild COPD, high risk for coronary disease stable  PVC's (premature ventricular contractions) No sx   Total  encounter time more than 25 minutes  Greater than 50% was spent in counseling and coordination of care with the patient   No orders of the defined types were placed in this encounter.    Signed, Esmond Plants, M.D., Ph.D. 03/24/2021  [place], [place]

## 2021-03-26 NOTE — Addendum Note (Signed)
Addended by: Anselm Pancoast on: 03/26/2021 11:33 AM   Modules accepted: Orders

## 2021-03-30 ENCOUNTER — Ambulatory Visit
Admission: RE | Admit: 2021-03-30 | Discharge: 2021-03-30 | Disposition: A | Discharge status: Home / Self Care | Payer: Medicare Other | Source: Ambulatory Visit | Attending: Internal Medicine | Admitting: Internal Medicine

## 2021-03-30 ENCOUNTER — Other Ambulatory Visit: Payer: Self-pay

## 2021-03-30 DIAGNOSIS — E7800 Pure hypercholesterolemia, unspecified: Secondary | ICD-10-CM | POA: Insufficient documentation

## 2021-03-30 DIAGNOSIS — N281 Cyst of kidney, acquired: Secondary | ICD-10-CM | POA: Insufficient documentation

## 2021-03-30 DIAGNOSIS — R7989 Other specified abnormal findings of blood chemistry: Secondary | ICD-10-CM

## 2021-04-19 NOTE — Progress Notes (Signed)
04/20/2021 10:26 AM   Kerry Rowland 1936/05/22 [id number]  Referring provider: Adin Hector, MD [facility] [place],  [place] [address]  Urological history: 1. BPH with LU TS -I PSS 3/2 -managed with finasteride 5 mg daily and tadalafil 5 mg daily  2. ED -contributing factors of age, testosterone deficiency, HLD, HTN and former smoker -no longer sexually active   3. Testosterone deficiency -testosterone therapy did not provide benefit  Chief Complaint  Patient presents with   Benign Prostatic Hypertrophy   HPI: Kerry Rowland is an 86 y.o. male presents today for a one year follow up.    He has no urinary complaints at this visit.  He wondering if he can go back to tamsulosin and discontinue the finasteride.    He had 1 incident of painless gross hematuria approximately 2 months ago.  Patient denies any modifying or aggravating factors.  Patient denies any dysuria or suprapubic/flank pain.  Patient denies any fevers, chills, nausea or vomiting.     IPSS     Row Name 04/20/21 0900         International Prostate Symptom Score   How often have you had the sensation of not emptying your bladder? Not at All     How often have you had to urinate less than every two hours? Not at All     How often have you found you stopped and started again several times when you urinated? Less than 1 in 5 times     How often have you found it difficult to postpone urination? Not at All     How often have you had a weak urinary stream? Less than 1 in 5 times     How often have you had to strain to start urination? Not at All     How many times did you typically get up at night to urinate? 1 Time     Total IPSS Score 3           Quality of Life due to urinary symptoms     If you were to spend the rest of your life with your urinary condition just the way it is now how would you feel about that? Mostly Satisfied              Score:  1-7  Mild 8-19 Moderate 20-35 Severe    PMH: Past Medical History:  Diagnosis Date   Acute idiopathic gout of right foot    Colon polyps    Constipation    Erectile dysfunction    Generalized abdominal mass    Hyperglycemia    Osteoarthritis    Paroxysmal atrial flutter (HCC)    Raynaud's phenomenon    Skin cancer     Surgical History: Past Surgical History:  Procedure Laterality Date   APPENDECTOMY     CATARACT EXTRACTION     COLONOSCOPY WITH PROPOFOL N/A 05/22/2015   Procedure: COLONOSCOPY WITH PROPOFOL;  Surgeon: Manya Silvas, MD;  Location: [facility] ENDOSCOPY;  Service: Endoscopy;  Laterality: N/A;   PACEMAKER IMPLANT N/A 02/25/2019   Procedure: PACEMAKER IMPLANT;  Surgeon: Deboraha Sprang, MD;  Location: [place] CV LAB;  Service: Cardiovascular;  Laterality: N/A;   skin grafts     TONSILLECTOMY      Home Medications:  Allergies as of 04/20/2021       Reactions   Morphine And Related Other (See Comments)   Chills, felt cold  Nsaids Other (See Comments)   intestinal bleeding   Other    Nightshade plants - inflammation pain   Phenergan [promethazine Hcl]    Uncontrolled muscle movement         Medication List        Accurate as of April 20, 2021 10:26 AM. If you have any questions, ask your nurse or doctor.          STOP taking these medications    finasteride 5 MG tablet Commonly known as: PROSCAR Stopped by: Kerry Council, PA-C       TAKE these medications    Alpha-Lipoic Acid 300 MG Caps Take by mouth.   b complex vitamins tablet Take 1 tablet by mouth daily.   B-12 PO Take 1 tablet by mouth daily.   BLACK PEPPER-TURMERIC PO Take 1 Dose by mouth daily.   cetirizine 10 MG tablet Commonly known as: ZYRTEC Take 10 mg by mouth daily as needed for allergies.   CINNAMON PO Take 1 Dose by mouth daily.   cyclobenzaprine 10 MG tablet Commonly known as: FLEXERIL Take 10 mg by mouth 3 (three) times daily as needed for muscle  spasms.   diphenhydrAMINE 25 MG tablet Commonly known as: BENADRYL Take 25 mg by mouth at bedtime as needed for sleep.   dutasteride 0.5 MG capsule Commonly known as: AVODART Take 1 capsule (0.5 mg total) by mouth daily. Started by: Kerry Council, PA-C   MAGNESIUM PO Take 1 tablet by mouth daily.   Multi-Minerals Tabs Take 1 tablet by mouth daily.   OVER THE COUNTER MEDICATION Take 1 Dose by mouth daily. Lion's mane otc supplement   OVER THE COUNTER MEDICATION Take 1 Dose by mouth daily. reishi otc supplement   PANTOTHENIC ACID PO Take 1 tablet by mouth daily.   rosuvastatin 10 MG tablet Commonly known as: CRESTOR Take 1 tablet (10 mg total) by mouth daily.   SAM-E COMPLETE PO Take 1 tablet by mouth daily.   sotalol 120 MG tablet Commonly known as: BETAPACE Take 120 mg by mouth 2 (two) times daily.   tadalafil 5 MG tablet Commonly known as: CIALIS Take 1 tablet (5 mg total) by mouth daily as needed for erectile dysfunction.   tamsulosin 0.4 MG Caps capsule Commonly known as: FLOMAX Take 1 capsule (0.4 mg total) by mouth daily.   triamcinolone cream 0.1 % Commonly known as: KENALOG APPLY TO THE AFFECTED AREA ON ARMS TWICE DAILY UNTIL CLEAR   VITAMIN B-2 PO Take 1 tablet by mouth daily.   VITAMIN C PO Take 1 tablet by mouth daily.   VITAMIN D PO Take 1 tablet by mouth daily.   VITAMIN E PO Take 1 capsule by mouth daily.   ZINC PO Take 1 tablet by mouth daily.        Allergies:  Allergies  Allergen Reactions   Morphine And Related Other (See Comments)    Chills, felt cold    Nsaids Other (See Comments)    intestinal bleeding   Other     Nightshade plants - inflammation pain   Phenergan [Promethazine Hcl]     Uncontrolled muscle movement     Family History: Family History  Problem Relation Age of Onset   Kidney disease Neg Hx    Prostate cancer Neg Hx    Kidney cancer Neg Hx    Bladder Cancer Neg Hx     Social History:  reports  that he has quit smoking. His smoking use included cigarettes.  He has never used smokeless tobacco. He reports current alcohol use. He reports that he does not use drugs.  ROS: For pertinent review of systems please refer to history of present illness  Physical Exam: BP 138/74   Pulse 67   Ht _0  (1.854 m)   Wt 164 lb (74.4 kg)   BMI 21.64 kg/m   Constitutional:  Well nourished. Alert and oriented, No acute distress. HEENT: [place] AT, mask in place.  Trachea midline Cardiovascular: No clubbing, cyanosis, or edema. Respiratory: Normal respiratory effort, no increased work of breathing. GU: No CVA tenderness.  No bladder fullness or masses.  Patient with uncircumcised phallus.  Foreskin easily retracted.  Urethral meatus is patent.  No penile discharge. No penile lesions or rashes. Scrotum without lesions, cysts, rashes and/or edema.  Testicles are located scrotally bilaterally. No masses are appreciated in the testicles. Left and right epididymis are normal. Rectal: Patient with  normal sphincter tone. Anus and perineum without scarring or rashes. No rectal masses are appreciated. Prostate is approximately 60 + grams, no nodules are appreciated. Seminal vesicles could not be palpated.  Neurologic: Grossly intact, no focal deficits, moving all 4 extremities. Psychiatric: Normal mood and affect.   Laboratory data: Glucose 70 - 110 mg/dL 86   Sodium 136 - 145 mmol/L 139   Potassium 3.6 - 5.1 mmol/L 3.9   Chloride 97 - 109 mmol/L 106   Carbon Dioxide (CO2) 22.0 - 32.0 mmol/L 28.9   Calcium 8.7 - 10.3 mg/dL 9.4   Urea Nitrogen (BUN) 7 - 25 mg/dL 28 High    Creatinine 0.7 - 1.3 mg/dL 1.3   Glomerular Filtration Rate (eGFR), MDRD Estimate >60 mL/min/1.73sq m 53 Low    BUN/Crea Ratio 6.0 - 20.0 21.5 High    Anion Gap w/K 6.0 - 16.0 8.0   Resulting Agency  [facility] - LAB  Specimen Collected: 04/07/21 08:59 Last Resulted: 04/07/21 11:09  Received From: [facility]   Result Received: 04/19/21 11:40   WBC (White Blood Cell Count) 4.1 - 10.2 10^3/uL 5.9   RBC (Red Blood Cell Count) 4.69 - 6.13 10^6/uL 4.00 Low    Hemoglobin 14.1 - 18.1 gm/dL 12.5 Low    Hematocrit 40.0 - 52.0 % 37.4 Low    MCV (Mean Corpuscular Volume) 80.0 - 100.0 fl 93.5   MCH (Mean Corpuscular Hemoglobin) 27.0 - 31.2 pg 31.3 High    MCHC (Mean Corpuscular Hemoglobin Concentration) 32.0 - 36.0 gm/dL 33.4   Platelet Count 150 - 450 10^3/uL 156   RDW-CV (Red Cell Distribution Width) 11.6 - 14.8 % 12.8   MPV (Mean Platelet Volume) 9.4 - 12.4 fl 10.6   Neutrophils 1.50 - 7.80 10^3/uL 4.03   Lymphocytes 1.00 - 3.60 10^3/uL 1.47   Monocytes 0.00 - 1.50 10^3/uL 0.32   Eosinophils 0.00 - 0.55 10^3/uL 0.04   Basophils 0.00 - 0.09 10^3/uL 0.02   Neutrophil % 32.0 - 70.0 % 68.4   Lymphocyte % 10.0 - 50.0 % 25.0   Monocyte % 4.0 - 13.0 % 5.4   Eosinophil % 1.0 - 5.0 % 0.7 Low    Basophil% 0.0 - 2.0 % 0.3   Immature Granulocyte % <=0.7 % 0.2   Immature Granulocyte Count <=0.06 10^3/L 0.01   Resulting Agency  [facility] - [facility]  Specimen Collected: 04/07/21 08:59 Last Resulted: 04/07/21 10:10  Received From: [facility]  Result Received: 04/19/21 11:40   Cholesterol, Total 100 - 200 mg/dL 140  Triglyceride 35 - 199 mg/dL 124   HDL (High Density Lipoprotein) Cholesterol 29.0 - 71.0 mg/dL 41.9   LDL Calculated 0 - 130 mg/dL 73   VLDL Cholesterol mg/dL 25   Cholesterol/HDL Ratio  3.3   Resulting Agency  [facility] - [facility]  Specimen Collected: 03/04/21 11:33 Last Resulted: 03/04/21 14:44  Received From: [facility]  Result Received: 03/06/21 14:10   Hemoglobin A1C 4.2 - 5.6 % 5.7 High    Average Blood Glucose (Calc) mg/dL 117   Resulting [facility] - LAB   Narrative Performed by [facility] - [facility] Normal Range:    4.2 - 5.6%  Increased Risk:  5.7 - 6.4%  Diabetes:        >= 6.5%  Glycemic Control for  adults with diabetes:  <7%   Specimen Collected: 03/04/21 11:33 Last Resulted: 03/04/21 13:52  Received From: [facility]  Result Received: 03/06/21 14:10   Thyroid Stimulating Hormone (TSH) 0.450-5.330 uIU/ml uIU/mL 1.010   Resulting [facility] - LAB  Specimen Collected: 03/04/21 11:33 Last Resulted: 03/04/21 14:09  Received From: [facility]  Result Received: 03/06/21 14:10   Color Yellow, Violet, Light Violet, Dark Violet Yellow   Clarity Clear Clear   Specific Gravity 1.000 - 1.030 1.025   pH, Urine 5.0 - 8.0 5.5   Protein, Urinalysis Negative, Trace mg/dL Negative   Glucose, Urinalysis Negative mg/dL Negative   Ketones, Urinalysis Negative mg/dL 15  Abnormal    Blood, Urinalysis Negative Negative   Nitrite, Urinalysis Negative Negative   Leukocyte Esterase, Urinalysis Negative Negative   White Blood Cells, Urinalysis None Seen, 0-3 /hpf 0-3   Red Blood Cells, Urinalysis None Seen, 0-3 /hpf None Seen   Bacteria, Urinalysis None Seen /hpf None Seen   Squamous Epithelial Cells, Urinalysis Rare, Few, None Seen /hpf Rare   Resulting Agency  [facility] - LAB  Specimen Collected: 03/04/21 11:33 Last Resulted: 03/04/21 13:33  Received From: [facility]  Result Received: 03/06/21 14:10  I have reviewed the labs.    Pertinent Imaging Narrative & Impression  CLINICAL DATA:  Elevated serum creatinine level.   EXAM: RENAL / URINARY TRACT ULTRASOUND COMPLETE   COMPARISON:  None.   FINDINGS: Right Kidney:   Renal measurements: 9.4 x 5.4 x 5.2 cm = volume: 137 mL. Echogenicity within normal limits. No mass or hydronephrosis visualized.   Left Kidney:   Renal measurements: 10.5 x 4.8 x 4.1 cm = volume: 107 mL. 5.9 cm simple cyst is seen arising from upper pole. Echogenicity within normal limits. No mass or hydronephrosis visualized.   Bladder:   Appears normal for degree of bladder distention.    Other:   Enlarged prostate gland is noted.   IMPRESSION: Large simple left renal cyst.  No other renal abnormality seen.     Electronically Signed   By: Marijo Conception M.D.   On: 03/31/2021 10:30  I have independently reviewed the films.  See HPI.       Assessment & Plan:    1. Gross hematuria -Explained that gross heme may be the result of kidney or bladder cancer and with his smoking history he is at a higher risk, but the gross he may also be the result of kidney stones, straining to urinate/have bowel movement and BPH -I explained the AUA recommendation the stratification for hematuria and he is currently placed in the high  risk category and the work-up would consist of a CT urogram and cystoscopy -He is not wanting to undergo these tests at this time, cited his age and co-morbities and he recently had a RUS and blood work and everything was normal -I explained that the renal ultrasound and blood work would not rule out a malignancy and he voices understanding, but he still stated he did not want to undergo these tests at this time unless he should experience further issue or another episode of gross heme  2. BPH with LUTS    -As patient continues to have episodes of hypotension, I have recommended that he stay on the tadalafil 5 mg daily -I have sent a prescription of tadalafil 5 mg daily to [facility] so that he can use the good Rx pricing -In regards to the finasteride, I have switched him to dutasteride 0.5 mg as this has a longer half-life and he can take this medication once a week as he is trying to reduce the amount of medication he takes daily -I have sent a prescription of dutasteride 0.5 mg to [facility] as well and he will discontinue the finasteride  3. Prostate cancer screening -explained that AUA recommendations of no longer screening men for prostate cancer after the age of 47 as the risk of prostate cancer detection and treatment outweigh the benefit -He stated he  would still like to continue with annual prostate exams and if we should discover an abnormal finding during the exam he would then at that time consider how to move forward    Return in about 1 year (around 04/20/2022) for IPSS and exam.  These notes generated with voice recognition software. I apologize for typographical errors.  Kerry Council, PA-C  [facility] [address] [place],  [address] [phone]

## 2021-04-20 ENCOUNTER — Other Ambulatory Visit: Payer: Self-pay

## 2021-04-20 ENCOUNTER — Encounter: Payer: Self-pay | Admitting: Urology

## 2021-04-20 ENCOUNTER — Ambulatory Visit: Payer: Medicare Other | Admitting: Urology

## 2021-04-20 VITALS — BP 138/74 | HR 67 | Ht 73.0 in | Wt 164.0 lb

## 2021-04-20 DIAGNOSIS — N401 Enlarged prostate with lower urinary tract symptoms: Secondary | ICD-10-CM

## 2021-04-20 DIAGNOSIS — N138 Other obstructive and reflux uropathy: Secondary | ICD-10-CM | POA: Diagnosis not present

## 2021-04-20 DIAGNOSIS — R310 Gross hematuria: Secondary | ICD-10-CM | POA: Diagnosis not present

## 2021-04-20 DIAGNOSIS — C61 Malignant neoplasm of prostate: Secondary | ICD-10-CM | POA: Diagnosis not present

## 2021-04-20 MED ORDER — DUTASTERIDE 0.5 MG PO CAPS: 0.5000 mg | ORAL_CAPSULE | Freq: Every day | ORAL | 0 refills | Status: DC

## 2021-04-20 MED ORDER — TADALAFIL 5 MG PO TABS: 5.0000 mg | ORAL_TABLET | Freq: Every day | ORAL | 3 refills | Status: DC | PRN

## 2021-04-27 ENCOUNTER — Ambulatory Visit: Payer: Self-pay | Admitting: Urology

## 2021-05-26 ENCOUNTER — Ambulatory Visit (INDEPENDENT_AMBULATORY_CARE_PROVIDER_SITE_OTHER): Payer: Medicare Other

## 2021-05-26 DIAGNOSIS — I441 Atrioventricular block, second degree: Secondary | ICD-10-CM | POA: Diagnosis not present

## 2021-06-01 LAB — CUP PACEART REMOTE DEVICE CHECK
Battery Remaining Longevity: 104 mo
Battery Voltage: 3 V
Brady Statistic AP VP Percent: 98.16 %
Brady Statistic AP VS Percent: 0.36 %
Brady Statistic AS VP Percent: 1.39 %
Brady Statistic AS VS Percent: 0.08 %
Brady Statistic RA Percent Paced: 98.51 %
Brady Statistic RV Percent Paced: 99.55 %
Date Time Interrogation Session: 20220831030147
Implantable Lead Implant Date: 20200601
Implantable Lead Implant Date: 20200601
Implantable Lead Location: 753859
Implantable Lead Location: 753860
Implantable Lead Model: 5076
Implantable Lead Model: 5076
Implantable Pulse Generator Implant Date: 20200601
Lead Channel Impedance Value: 285 Ohm
Lead Channel Impedance Value: 361 Ohm
Lead Channel Impedance Value: 418 Ohm
Lead Channel Impedance Value: 437 Ohm
Lead Channel Pacing Threshold Amplitude: 0.625 V
Lead Channel Pacing Threshold Amplitude: 0.625 V
Lead Channel Pacing Threshold Pulse Width: 0.4 ms
Lead Channel Pacing Threshold Pulse Width: 0.4 ms
Lead Channel Sensing Intrinsic Amplitude: 1.625 mV
Lead Channel Sensing Intrinsic Amplitude: 1.625 mV
Lead Channel Sensing Intrinsic Amplitude: 5.625 mV
Lead Channel Sensing Intrinsic Amplitude: 5.625 mV
Lead Channel Setting Pacing Amplitude: 1.5 V
Lead Channel Setting Pacing Amplitude: 2.5 V
Lead Channel Setting Pacing Pulse Width: 0.4 ms
Lead Channel Setting Sensing Sensitivity: 2 mV

## 2021-06-08 NOTE — Progress Notes (Signed)
Remote pacemaker transmission.   

## 2021-07-14 ENCOUNTER — Other Ambulatory Visit: Payer: Self-pay | Admitting: Internal Medicine

## 2021-07-15 NOTE — Telephone Encounter (Signed)
Patient unable to schedule at this time

## 2021-07-15 NOTE — Telephone Encounter (Signed)
Please schedule overdue 6 month F/U with Dr. Caryl Comes. Thank you!

## 2021-08-25 ENCOUNTER — Ambulatory Visit (INDEPENDENT_AMBULATORY_CARE_PROVIDER_SITE_OTHER): Payer: Medicare Other

## 2021-08-25 DIAGNOSIS — I441 Atrioventricular block, second degree: Secondary | ICD-10-CM

## 2021-08-25 LAB — CUP PACEART REMOTE DEVICE CHECK
Battery Remaining Longevity: 98 mo
Battery Voltage: 2.99 V
Brady Statistic AP VP Percent: 98.96 %
Brady Statistic AP VS Percent: 0.01 %
Brady Statistic AS VP Percent: 1.02 %
Brady Statistic AS VS Percent: 0.01 %
Brady Statistic RA Percent Paced: 98.96 %
Brady Statistic RV Percent Paced: 99.98 %
Date Time Interrogation Session: 20221130010635
Implantable Lead Implant Date: 20200601
Implantable Lead Implant Date: 20200601
Implantable Lead Location: 753859
Implantable Lead Location: 753860
Implantable Lead Model: 5076
Implantable Lead Model: 5076
Implantable Pulse Generator Implant Date: 20200601
Lead Channel Impedance Value: 266 Ohm
Lead Channel Impedance Value: 342 Ohm
Lead Channel Impedance Value: 399 Ohm
Lead Channel Impedance Value: 399 Ohm
Lead Channel Pacing Threshold Amplitude: 0.625 V
Lead Channel Pacing Threshold Amplitude: 0.75 V
Lead Channel Pacing Threshold Pulse Width: 0.4 ms
Lead Channel Pacing Threshold Pulse Width: 0.4 ms
Lead Channel Sensing Intrinsic Amplitude: 1.375 mV
Lead Channel Sensing Intrinsic Amplitude: 1.375 mV
Lead Channel Sensing Intrinsic Amplitude: 4.625 mV
Lead Channel Sensing Intrinsic Amplitude: 4.625 mV
Lead Channel Setting Pacing Amplitude: 1.5 V
Lead Channel Setting Pacing Amplitude: 2.5 V
Lead Channel Setting Pacing Pulse Width: 0.4 ms
Lead Channel Setting Sensing Sensitivity: 2 mV

## 2021-08-27 ENCOUNTER — Other Ambulatory Visit: Payer: Self-pay | Admitting: Internal Medicine

## 2021-08-30 NOTE — Telephone Encounter (Signed)
Please schedule overdue F/U appointment with Dr. Caryl Comes. Thank you!

## 2021-08-30 NOTE — Telephone Encounter (Signed)
This is a [facility] pt 

## 2021-08-31 NOTE — Telephone Encounter (Signed)
Patient out of town till May will call to schedule

## 2021-09-03 NOTE — Progress Notes (Signed)
Remote pacemaker transmission.   

## 2021-11-16 ENCOUNTER — Other Ambulatory Visit: Payer: Self-pay | Admitting: Urology

## 2021-11-16 DIAGNOSIS — N138 Other obstructive and reflux uropathy: Secondary | ICD-10-CM

## 2021-11-16 MED ORDER — DUTASTERIDE 0.5 MG PO CAPS: 0.5000 mg | ORAL_CAPSULE | Freq: Every day | ORAL | 0 refills | Status: DC

## 2021-11-16 NOTE — Telephone Encounter (Signed)
He was taking it daily, per pt he didn't know he was suppose to take it weekly. I will send in for daily use.  Spoke with patient and advised results  FYI- pt lives in [place] for now, rx sent to [place]

## 2021-11-16 NOTE — Telephone Encounter (Signed)
Is Avodart once weekly or once daily?

## 2021-11-16 NOTE — Telephone Encounter (Signed)
Pt is scheduled for year fu on 7/27 with [facility]. He said he will be out of his medication by then and wants to know if he should move his July appt up to the end of May or will [facility] be able to give him a refill to tide him over until the July appt. I asked what medication and he could not say right off, but that he's only had one refill of it.I can call him back when I get your response.

## 2021-11-16 NOTE — Telephone Encounter (Signed)
Pt calling asking for refills of Avodart, pt has been taking this medication daily as per the prescription.   Per OV note : 2. BPH with LUTS    -As patient continues to have episodes of hypotension, I have recommended that he stay on the tadalafil 5 mg daily -I have sent a prescription of tadalafil 5 mg daily to [facility] so that he can use the good Rx pricing -In regards to the finasteride, I have switched him to dutasteride 0.5 mg as this has a longer half-life and he can take this medication once a week as he is trying to reduce the amount of medication he takes daily -I have sent a prescription of dutasteride 0.5 mg to [facility] as well and he will discontinue the finasteride  Pt states he will run out before July. Also should pt be taking flomax as well? ( Still on medication list)

## 2021-11-24 ENCOUNTER — Ambulatory Visit (INDEPENDENT_AMBULATORY_CARE_PROVIDER_SITE_OTHER): Payer: Medicare Other

## 2021-11-24 DIAGNOSIS — I441 Atrioventricular block, second degree: Secondary | ICD-10-CM

## 2021-11-24 LAB — CUP PACEART REMOTE DEVICE CHECK
Battery Remaining Longevity: 96 mo
Battery Voltage: 2.99 V
Brady Statistic AP VP Percent: 97.18 %
Brady Statistic AP VS Percent: 0 %
Brady Statistic AS VP Percent: 2.81 %
Brady Statistic AS VS Percent: 0 %
Brady Statistic RA Percent Paced: 97.16 %
Brady Statistic RV Percent Paced: 100 %
Date Time Interrogation Session: 20230301020818
Implantable Lead Implant Date: 20200601
Implantable Lead Implant Date: 20200601
Implantable Lead Location: 753859
Implantable Lead Location: 753860
Implantable Lead Model: 5076
Implantable Lead Model: 5076
Implantable Pulse Generator Implant Date: 20200601
Lead Channel Impedance Value: 285 Ohm
Lead Channel Impedance Value: 361 Ohm
Lead Channel Impedance Value: 380 Ohm
Lead Channel Impedance Value: 418 Ohm
Lead Channel Pacing Threshold Amplitude: 0.625 V
Lead Channel Pacing Threshold Amplitude: 0.625 V
Lead Channel Pacing Threshold Pulse Width: 0.4 ms
Lead Channel Pacing Threshold Pulse Width: 0.4 ms
Lead Channel Sensing Intrinsic Amplitude: 1.625 mV
Lead Channel Sensing Intrinsic Amplitude: 1.625 mV
Lead Channel Sensing Intrinsic Amplitude: 6.375 mV
Lead Channel Sensing Intrinsic Amplitude: 6.375 mV
Lead Channel Setting Pacing Amplitude: 1.5 V
Lead Channel Setting Pacing Amplitude: 2.5 V
Lead Channel Setting Pacing Pulse Width: 0.4 ms
Lead Channel Setting Sensing Sensitivity: 2 mV

## 2021-12-01 NOTE — Progress Notes (Signed)
Remote pacemaker transmission.   

## 2022-02-08 ENCOUNTER — Encounter: Payer: Medicare Other | Admitting: Internal Medicine

## 2022-02-15 ENCOUNTER — Encounter: Payer: Medicare Other | Admitting: Internal Medicine

## 2022-02-23 ENCOUNTER — Ambulatory Visit (INDEPENDENT_AMBULATORY_CARE_PROVIDER_SITE_OTHER): Payer: Medicare Other

## 2022-02-23 DIAGNOSIS — I441 Atrioventricular block, second degree: Secondary | ICD-10-CM

## 2022-02-24 LAB — CUP PACEART REMOTE DEVICE CHECK
Battery Remaining Longevity: 94 mo
Battery Voltage: 2.99 V
Brady Statistic AP VP Percent: 98.65 %
Brady Statistic AP VS Percent: 0 %
Brady Statistic AS VP Percent: 1.34 %
Brady Statistic AS VS Percent: 0 %
Brady Statistic RA Percent Paced: 98.63 %
Brady Statistic RV Percent Paced: 100 %
Date Time Interrogation Session: 20230531011235
Implantable Lead Implant Date: 20200601
Implantable Lead Implant Date: 20200601
Implantable Lead Location: 753859
Implantable Lead Location: 753860
Implantable Lead Model: 5076
Implantable Lead Model: 5076
Implantable Pulse Generator Implant Date: 20200601
Lead Channel Impedance Value: 304 Ohm
Lead Channel Impedance Value: 380 Ohm
Lead Channel Impedance Value: 399 Ohm
Lead Channel Impedance Value: 437 Ohm
Lead Channel Pacing Threshold Amplitude: 0.625 V
Lead Channel Pacing Threshold Amplitude: 0.625 V
Lead Channel Pacing Threshold Pulse Width: 0.4 ms
Lead Channel Pacing Threshold Pulse Width: 0.4 ms
Lead Channel Sensing Intrinsic Amplitude: 1.5 mV
Lead Channel Sensing Intrinsic Amplitude: 1.5 mV
Lead Channel Sensing Intrinsic Amplitude: 6.375 mV
Lead Channel Sensing Intrinsic Amplitude: 6.375 mV
Lead Channel Setting Pacing Amplitude: 1.5 V
Lead Channel Setting Pacing Amplitude: 2.5 V
Lead Channel Setting Pacing Pulse Width: 0.4 ms
Lead Channel Setting Sensing Sensitivity: 2 mV

## 2022-03-08 ENCOUNTER — Encounter: Payer: Self-pay | Admitting: Internal Medicine

## 2022-03-08 ENCOUNTER — Ambulatory Visit (INDEPENDENT_AMBULATORY_CARE_PROVIDER_SITE_OTHER): Payer: Medicare Other | Admitting: Internal Medicine

## 2022-03-08 VITALS — BP 128/70 | HR 62 | Ht 73.0 in | Wt 171.0 lb

## 2022-03-08 DIAGNOSIS — I495 Sick sinus syndrome: Secondary | ICD-10-CM | POA: Diagnosis not present

## 2022-03-08 LAB — PACEMAKER DEVICE OBSERVATION

## 2022-03-08 NOTE — Patient Instructions (Signed)
Medication Instructions:  Your physician recommends that you continue on your current medications as directed. Please refer to the Current Medication list given to you today.  *If you need a refill on your cardiac medications before your next appointment, please call your pharmacy*   Lab Work: None ordered If you have labs (blood work) drawn today and your tests are completely normal, you will receive your results only by: [place] (if you have MyChart) OR A paper copy in the mail If you have any lab test that is abnormal or we need to change your treatment, we will call you to review the results.   Testing/Procedures: None ordered   Follow-Up: At [facility], you and your health needs are our priority.  As part of our continuing mission to provide you with exceptional heart care, we have created designated Provider Care Teams.  These Care Teams include your primary Cardiologist (physician) and Advanced Practice Providers (APPs -  Physician Assistants and Nurse Practitioners) who all work together to provide you with the care you need, when you need it.  We recommend signing up for the patient portal called "MyChart".  Sign up information is provided on this After Visit Summary.  MyChart is used to connect with patients for Virtual Visits (Telemedicine).  Patients are able to view lab/test results, encounter notes, upcoming appointments, etc.  Non-urgent messages can be sent to your provider as well.   To learn more about what you can do with MyChart, go to [email or web address].    Your next appointment:   Your physician wants you to follow-up in: 12 months You will receive a reminder letter in the mail two months in advance. If you don't receive a letter, please call our office to schedule the follow-up appointment.   The format for your next appointment:   In Person  Provider:   [name], MD  Other Instructions N/A  Important Information About  [place]

## 2022-03-08 NOTE — Progress Notes (Signed)
Patient Care Team: Adin Hector, MD as PCP - General (Internal Medicine)   HPI  Kerry Rowland is a 86 y.o. 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  Hx of atrial flutter and fib dating back about 20 yrs.  Had been treated with sotalol and without anticoagulation.  Interval development of complete heart block     The patient denies chest pain, nocturnal dyspnea, orthopnea or peripheral edema.  There have been no palpitations or syncope.  Complains of mild dyspnea but not really limiting.  Orthostatic lightheadedness, continues with fatigue postprandially; occasionally has measured his blood pressure and it is low.  Not sure that there is a correlation..    Date Cr K GFR CKD Hgb Mg  5/20 0.97 4.5  13.6 2.1 (9/20)  6/21 1.2 4.2  13.6 2.1 (CE)   10/22 1.3 4.5 54 13.5        Past Medical History:  Diagnosis Date   Acute idiopathic gout of right foot    Colon polyps    Constipation    Erectile dysfunction    Generalized abdominal mass    Hyperglycemia    Osteoarthritis    Paroxysmal atrial flutter ([place])    Raynaud's phenomenon    Skin cancer     Past Surgical History:  Procedure Laterality Date   APPENDECTOMY     CATARACT EXTRACTION     COLONOSCOPY WITH PROPOFOL N/A 05/22/2015   Procedure: COLONOSCOPY WITH PROPOFOL;  Surgeon: Manya Silvas, MD;  Location[facility] ENDOSCOPY;  Service: Endoscopy;  Laterality: N/A;   PACEMAKER IMPLANT N/A 02/25/2019   Procedure: PACEMAKER IMPLANT;  Surgeon: Deboraha Sprang, MD;  Location: [facility] CV LAB;  Service: Cardiovascular;  Laterality: N/A;   RETINAL DETACHMENT SURGERY     2023   skin grafts     TONSILLECTOMY     Allergies as of 03/08/2022       Reactions   Morphine And Related Other (See Comments)   Chills, felt cold    Nsaids Other (See Comments)   intestinal bleeding   Other    Nightshade plants - inflammation pain   Phenergan [promethazine Hcl]    Uncontrolled muscle movement               TAKE these medications    Alpha-Lipoic Acid 300 MG Caps Take by mouth.   b complex vitamins tablet Take 1 tablet by mouth daily.   BLACK PEPPER-TURMERIC PO Take 1 Dose by mouth daily.   cetirizine 10 MG tablet Commonly known as: ZYRTEC Take 10 mg by mouth daily as needed for allergies.   diphenhydrAMINE 25 MG tablet Commonly known as: BENADRYL Take 25 mg by mouth at bedtime as needed for sleep.   dutasteride 0.5 MG capsule Commonly known as: AVODART Take 1 capsule (0.5 mg total) by mouth daily.   MAGNESIUM PO Take 1 tablet by mouth daily.   Multi-Minerals Tabs Take 1 tablet by mouth daily.   rosuvastatin 10 MG tablet Commonly known as: CRESTOR TAKE 1 TABLET BY MOUTH DAILY   sotalol 120 MG tablet Commonly known as: BETAPACE Take 120 mg by mouth 2 (two) times daily.   tadalafil 5 MG tablet Commonly known as: CIALIS Take 1 tablet (5 mg total) by mouth daily as needed for erectile dysfunction.   triamcinolone cream 0.1 % Commonly known as: KENALOG APPLY TO THE AFFECTED AREA ON ARMS TWICE DAILY UNTIL CLEAR   VITAMIN C PO Take  1 tablet by mouth daily.   VITAMIN D PO Take 1 tablet by mouth daily.   VITAMIN E PO Take 1 capsule by mouth daily.   ZINC PO Take 1 tablet by mouth daily.         Allergies  Allergen Reactions   Morphine And Related Other (See Comments)    Chills, felt cold    Nsaids Other (See Comments)    intestinal bleeding   Other     Nightshade plants - inflammation pain   Phenergan [Promethazine Hcl]     Uncontrolled muscle movement       Review of Systems negative except from HPI and PMH  Physical Exam BP 128/70   Pulse 62   Ht '6\' 1"'$  (1.854 m)   Wt 171 lb (77.6 kg)   SpO2 98%   BMI 22.56 kg/m  Well developed and well nourished in no acute distress HENT normal Neck supple with JVP-flat Clear Device pocket well healed; without hematoma or erythema.  There is no tethering  Regular rate and rhythm, no  murmur Abd-soft with active BS No Clubbing cyanosis  edema Skin-warm and dry A & Oriented  Grossly normal sensory and motor function  ECG AV pacing  Office walk test heart rate 84  Arrhythmias./fibrillation    Sinus bradycardia-chronotropic incompetence  Pacemaker Medtronic   AV block complete  Orthostatic lightheadedness   High Risk Medication Surveillance sotalol  Hyperlipidemia  Renal insufficiency grade 3A   Patient has had no intercurrent atrial arrhythmias.  He remains on sotalol and by choice not on anticoagulation.  His GFR is just under 60, we will clarify these labs with his blood draw later this month.  If it remains so, his sotalol dose is appropriately changed to every 24 hours.  Otherwise surveillance laboratories and QT interval are within range.  Device function is normal.  Blood pressure is reasonably controlled, was elevated on arrival but normalized on recheck.

## 2022-03-09 NOTE — Progress Notes (Signed)
Remote pacemaker transmission.   

## 2022-03-24 ENCOUNTER — Telehealth: Payer: Self-pay | Admitting: Internal Medicine

## 2022-03-24 NOTE — Telephone Encounter (Signed)
Per Judson Roch,  Pt is having Vitrectomy (as it states)  where the Dr will take out some of the vitreous around the right eye

## 2022-03-24 NOTE — Telephone Encounter (Signed)
   Pre-operative Risk Assessment    Patient Name: Kerry Rowland  DOB: November 10, 1935 MRN: [id number] Date of Surgery:  Clearance  TYPE OF SURGERY: VITRECTOMY   03/28/22                               Surgeon:  Deeann Cree Surgeon's Group or Practice Name:  [facility] Phone number:  [phone] Fax number:  [phone]   Type of Clearance Requested:   - Medical    Type of Anesthesia:  MAC   Additional requests/questions:    Signed, Eli Phillips   03/24/2022, 9:50 AM

## 2022-03-28 NOTE — Telephone Encounter (Signed)
Thx  should be at acceptable risk for surgery -vitrectomy

## 2022-03-28 NOTE — Telephone Encounter (Signed)
   Patient Name: Kerry Rowland  DOB: [birth year] MRN: [id number]  Primary Cardiologist: [name], MD  Chart reviewed as part of pre-operative protocol coverage. Given past medical history and time since last visit, based on ACC/AHA guidelines, and per Dr. Caryl Comes, primary cardiologist, [name] would be at acceptable risk for the planned procedure without further cardiovascular testing.   I will route this recommendation to the requesting party via Epic fax function and remove from pre-op pool.  Please call with questions.  Lenna Sciara, NP 03/28/2022, 11:59 AM

## 2022-04-07 ENCOUNTER — Encounter: Payer: Self-pay | Admitting: Internal Medicine

## 2022-04-08 ENCOUNTER — Encounter: Payer: Self-pay | Admitting: Internal Medicine

## 2022-04-12 NOTE — Telephone Encounter (Signed)
I thought I had answered this  please apologize  His calculated GFR is between 40-50 depending on the calculation, with this, his sotalol should be dosed daily instead of every 12 thanks

## 2022-04-13 ENCOUNTER — Other Ambulatory Visit: Payer: Self-pay | Admitting: *Deleted

## 2022-04-21 ENCOUNTER — Encounter: Payer: Self-pay | Admitting: Urology

## 2022-04-21 ENCOUNTER — Ambulatory Visit: Payer: Medicare Other | Admitting: Urology

## 2022-04-21 DIAGNOSIS — Z125 Encounter for screening for malignant neoplasm of prostate: Secondary | ICD-10-CM | POA: Diagnosis not present

## 2022-04-21 DIAGNOSIS — N138 Other obstructive and reflux uropathy: Secondary | ICD-10-CM

## 2022-04-21 DIAGNOSIS — N401 Enlarged prostate with lower urinary tract symptoms: Secondary | ICD-10-CM | POA: Diagnosis not present

## 2022-04-21 MED ORDER — TADALAFIL 5 MG PO TABS
5.0000 mg | ORAL_TABLET | Freq: Every day | ORAL | 3 refills | Status: DC | PRN
Start: 2022-04-21 — End: 2023-04-25

## 2022-04-21 NOTE — Progress Notes (Signed)
04/21/22 2:00 PM   Kerry Rowland 02-May-1936 [id number]  Referring provider:  Adin Hector, MD [place],  [place] [address]  Chief Complaint  Patient presents with   Benign Prostatic Hypertrophy   Erectile Dysfunction    Urological history  1. BPH with LU TS -I PSS 6/1 -managed with finasteride 5 mg daily and tadalafil 5 mg daily   2. ED -contributing factors of age, testosterone deficiency, HLD, HTN and former smoker -no longer sexually active    3. Testosterone deficiency -testosterone therapy did not provide benefit   HPI: Kerry Rowland is a 86 y.o.male who presents today for a one year follow-up.   He has no urinary complaints at this visit. He is still on tadalafil 5 mg daily and dutasteride 0.5 mg.  He has not urinary complaints at this time.   Patient denies any modifying or aggravating factors. Patient denies any gross hematuria, dysuria or suprapubic/flank pain. Patient denies any fevers, chills, nausea or vomiting.      IPSS     Row Name 04/21/22 1300         International Prostate Symptom Score   How often have you had the sensation of not emptying your bladder? Not at All     How often have you had to urinate less than every two hours? Less than 1 in 5 times     How often have you found you stopped and started again several times when you urinated? About half the time     How often have you found it difficult to postpone urination? Not at All     How often have you had a weak urinary stream? Less than half the time     How often have you had to strain to start urination? Not at All     How many times did you typically get up at night to urinate? None     Total IPSS Score 6       Quality of Life due to urinary symptoms   If you were to spend the rest of your life with your urinary condition just the way it is now how would you feel about that? Pleased              Score:  1-7 Mild 8-19  Moderate 20-35 Severe   PMH: Past Medical History:  Diagnosis Date   Acute idiopathic gout of right foot    Colon polyps    Constipation    Erectile dysfunction    Generalized abdominal mass    Hyperglycemia    Osteoarthritis    Paroxysmal atrial flutter (HCC)    Raynaud's phenomenon    Skin cancer     Surgical History: Past Surgical History:  Procedure Laterality Date   APPENDECTOMY     CATARACT EXTRACTION     COLONOSCOPY WITH PROPOFOL N/A 05/22/2015   Procedure: COLONOSCOPY WITH PROPOFOL;  Surgeon: Manya Silvas, MD;  Location: [facility] ENDOSCOPY;  Service: Endoscopy;  Laterality: N/A;   PACEMAKER IMPLANT N/A 02/25/2019   Procedure: PACEMAKER IMPLANT;  Surgeon: Deboraha Sprang, MD;  Location: [place] CV LAB;  Service: Cardiovascular;  Laterality: N/A;   RETINAL DETACHMENT SURGERY     2023   skin grafts     TONSILLECTOMY      Home Medications:  Allergies as of 04/21/2022       Reactions   Morphine And Related Other (See Comments)   Chills, felt  cold    Nsaids Other (See Comments)   intestinal bleeding   Other    Nightshade plants - inflammation pain   Phenergan [promethazine Hcl]    Uncontrolled muscle movement         Medication List        Accurate as of April 21, 2022  2:00 PM. If you have any questions, ask your nurse or doctor.          Alpha-Lipoic Acid 300 MG Caps Take by mouth.   b complex vitamins tablet Take 1 tablet by mouth daily.   BLACK PEPPER-TURMERIC PO Take 1 Dose by mouth daily.   cetirizine 10 MG tablet Commonly known as: ZYRTEC Take 10 mg by mouth daily as needed for allergies.   diphenhydrAMINE 25 MG tablet Commonly known as: BENADRYL Take 25 mg by mouth at bedtime as needed for sleep.   dutasteride 0.5 MG capsule Commonly known as: AVODART Take 1 capsule (0.5 mg total) by mouth daily.   MAGNESIUM PO Take 1 tablet by mouth daily.   Multi-Minerals Tabs Take 1 tablet by mouth daily.   rosuvastatin 10 MG  tablet Commonly known as: CRESTOR TAKE 1 TABLET BY MOUTH DAILY   sotalol 120 MG tablet Commonly known as: BETAPACE Take 1 tablet (120 mg) by mouth once daily   tadalafil 5 MG tablet Commonly known as: CIALIS Take 1 tablet (5 mg total) by mouth daily as needed for erectile dysfunction.   triamcinolone cream 0.1 % Commonly known as: KENALOG APPLY TO THE AFFECTED AREA ON ARMS TWICE DAILY UNTIL CLEAR   VITAMIN C PO Take 1 tablet by mouth daily.   VITAMIN D PO Take 1 tablet by mouth daily.   VITAMIN E PO Take 1 capsule by mouth daily.   ZINC PO Take 1 tablet by mouth daily.        Allergies:  Allergies  Allergen Reactions   Morphine And Related Other (See Comments)    Chills, felt cold    Nsaids Other (See Comments)    intestinal bleeding   Other     Nightshade plants - inflammation pain   Phenergan [Promethazine Hcl]     Uncontrolled muscle movement     Family History: Family History  Problem Relation Age of Onset   Kidney disease Neg Hx    Prostate cancer Neg Hx    Kidney cancer Neg Hx    Bladder Cancer Neg Hx     Social History:  reports that he has quit smoking. His smoking use included cigarettes. He has never used smokeless tobacco. He reports current alcohol use. He reports that he does not use drugs.   Physical Exam: BP 137/73   Pulse 74   Ht _0  (1.854 m)   Wt 170 lb (77.1 kg)   BMI 22.43 kg/m   Constitutional:  Alert and oriented, No acute distress. HEENT: [place] AT, moist mucus membranes.  Trachea midline Cardiovascular: No clubbing, cyanosis, or edema. Respiratory: Normal respiratory effort, no increased work of breathing. GI: Abdomen is soft, nontender, nondistended, no abdominal masses GU:  No CVA tenderness.  No bladder fullness or masses.  Patient with uncircumcised phallus. Foreskin easily retracted Urethral meatus is patent.  No penile discharge. No penile lesions or rashes. Scrotum without lesions, cysts, rashes and/or edema.  Bilateral  hydroceles, testicles are located scrotally bilaterally. No masses are appreciated in the testicles. Left and right epididymis are normal. Rectal: Patient with  normal sphincter tone. Anus and perineum without scarring  or rashes. No rectal masses are appreciated. Prostate is approximately 50 grams, could only palpate the apex, no nodules are appreciated. Seminal vesicles could not be palpated Neurologic: Grossly intact, no focal deficits, moving all 4 extremities. Psychiatric: Normal mood and affect.  Laboratory Data:  Ref Range & Units 3 wk ago  Thyroid Stimulating Hormone (TSH) 0.450-5.330 uIU/ml uIU/mL 1.463   Resulting Agency  [facility] - LAB    Ref Range & Units 3 wk ago  Creatinine, Random Urine 40.0 - 300.0 mg/dL 123.1   Urine Albumin, Random mg/L <7   Urine Albumin/Creatinine Ratio <30.0 ug/mg <5.7     Ref Range & Units 3 wk ago  Cholesterol, Total 100 - 200 mg/dL 239 High    Triglyceride 35 - 199 mg/dL 145   HDL (High Density Lipoprotein) Cholesterol 29.0 - 71.0 mg/dL 46.7   LDL Calculated 0 - 130 mg/dL 163 High    VLDL Cholesterol mg/dL 29   Cholesterol/HDL Ratio  5.1   Resulting Agency  [facility] - [facility]    Ref Range & Units 3 wk ago  Hemoglobin A1C 4.2 - 5.6 % 5.8 High    Average Blood Glucose (Calc) mg/dL 120   Resulting Agency  [facility] - [facility]   WBC (White Blood Cell Count) 4.1 - 10.2 10^3/uL 8.0   RBC (Red Blood Cell Count) 4.69 - 6.13 10^6/uL 4.76   Hemoglobin 14.1 - 18.1 gm/dL 14.6   Hematocrit 40.0 - 52.0 % 44.5   MCV (Mean Corpuscular Volume) 80.0 - 100.0 fl 93.5   MCH (Mean Corpuscular Hemoglobin) 27.0 - 31.2 pg 30.7   MCHC (Mean Corpuscular Hemoglobin Concentration) 32.0 - 36.0 gm/dL 32.8   Platelet Count 150 - 450 10^3/uL 153   RDW-CV (Red Cell Distribution Width) 11.6 - 14.8 % 12.6   MPV (Mean Platelet Volume) 9.4 - 12.4 fl 10.9   Neutrophils 1.50 - 7.80 10^3/uL 4.89   Lymphocytes 1.00 - 3.60 10^3/uL 2.13   Monocytes 0.00  - 1.50 10^3/uL 0.74   Eosinophils 0.00 - 0.55 10^3/uL 0.12   Basophils 0.00 - 0.09 10^3/uL 0.05   Neutrophil % 32.0 - 70.0 % 61.4   Lymphocyte % 10.0 - 50.0 % 26.8   Monocyte % 4.0 - 13.0 % 9.3   Eosinophil % 1.0 - 5.0 % 1.5   Basophil% 0.0 - 2.0 % 0.6   Immature Granulocyte % <=0.7 % 0.4   Immature Granulocyte Count <=0.06 10^3/L 0.03   Resulting Agency  [facility] - LAB    Ref Range & Units 3 wk ago  Glucose 70 - 110 mg/dL 97   Sodium 136 - 145 mmol/L 139   Potassium 3.6 - 5.1 mmol/L 4.3   Chloride 97 - 109 mmol/L 102   Carbon Dioxide (CO2) 22.0 - 32.0 mmol/L 28.5   Urea Nitrogen (BUN) 7 - 25 mg/dL 27 High    Creatinine 0.7 - 1.3 mg/dL 1.4 High    Glomerular Filtration Rate (eGFR), MDRD Estimate >60 mL/min/1.73sq m 48 Low    Calcium 8.7 - 10.3 mg/dL 9.7   AST  8 - 39 U/L 16   ALT  6 - 57 U/L 11   Alk Phos (alkaline Phosphatase) 34 - 104 U/L 66   Albumin 3.5 - 4.8 g/dL 4.5   Bilirubin, Total 0.3 - 1.2 mg/dL 0.9   Protein, Total 6.1 - 7.9 g/dL 7.3   A/G Ratio 1.0 - 5.0 gm/dL 1.6   Resulting Agency  [facility]  I have reviewed the labs.   Assessment & Plan:    BPH with LUTS  -Continue tadalafil; refill sent today  - continue dutasteride 0.5 mg daily   2. History of gross hematuria  - Patient denies any episode of gross hematuria   3. Prostate cancer screening  - DRE benign    Return in about 1 year (around 04/22/2023) for I PSS and exam .  [facility] [address] [place], [place] [address] [phone]  I, Kirke Shaggy Littlejohn,acting as a scribe for [facility], PA-C.,have documented all relevant documentation on the behalf of Beverly Ferner, PA-C,as directed by  [facility], PA-C while in the presence of Kayvon Mo, PA-C.  I have reviewed the above documentation for accuracy and completeness, and I agree with the above.    Zara Council, PA-C

## 2022-05-25 ENCOUNTER — Ambulatory Visit (INDEPENDENT_AMBULATORY_CARE_PROVIDER_SITE_OTHER): Payer: Medicare Other

## 2022-05-25 DIAGNOSIS — I495 Sick sinus syndrome: Secondary | ICD-10-CM

## 2022-05-29 LAB — CUP PACEART REMOTE DEVICE CHECK
Battery Remaining Longevity: 101 mo
Battery Voltage: 2.99 V
Brady Statistic AP VP Percent: 96.73 %
Brady Statistic AP VS Percent: 0 %
Brady Statistic AS VP Percent: 3.26 %
Brady Statistic AS VS Percent: 0.01 %
Brady Statistic RA Percent Paced: 96.7 %
Brady Statistic RV Percent Paced: 99.99 %
Date Time Interrogation Session: 20230830034534
Implantable Lead Implant Date: 20200601
Implantable Lead Implant Date: 20200601
Implantable Lead Location: 753859
Implantable Lead Location: 753860
Implantable Lead Model: 5076
Implantable Lead Model: 5076
Implantable Pulse Generator Implant Date: 20200601
Lead Channel Impedance Value: 285 Ohm
Lead Channel Impedance Value: 361 Ohm
Lead Channel Impedance Value: 380 Ohm
Lead Channel Impedance Value: 437 Ohm
Lead Channel Pacing Threshold Amplitude: 0.625 V
Lead Channel Pacing Threshold Amplitude: 0.75 V
Lead Channel Pacing Threshold Pulse Width: 0.4 ms
Lead Channel Pacing Threshold Pulse Width: 0.4 ms
Lead Channel Sensing Intrinsic Amplitude: 1.5 mV
Lead Channel Sensing Intrinsic Amplitude: 1.5 mV
Lead Channel Sensing Intrinsic Amplitude: 6.375 mV
Lead Channel Sensing Intrinsic Amplitude: 6.375 mV
Lead Channel Setting Pacing Amplitude: 1.5 V
Lead Channel Setting Pacing Amplitude: 2 V
Lead Channel Setting Pacing Pulse Width: 0.4 ms
Lead Channel Setting Sensing Sensitivity: 2 mV

## 2022-06-17 NOTE — Progress Notes (Signed)
Remote pacemaker transmission.   

## 2022-07-11 NOTE — Progress Notes (Unsigned)
Cardiology Office Note  Date:  07/12/2022   ID:  Kerry Rowland, DOB 1935/11/12, MRN [id number]  PCP:  Adin Hector, MD   Chief Complaint  Patient presents with   12 month follow up     "Doing well." Medications reviewed by the patient verbally.     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 Who presents for follow-up of his paroxysmal atrial flutter, bradycardia, palpitations  Last seen by myself in clinic June 2022 Seen by EP, Dr. Caryl Comes June 2023 No significant atrial arrhythmias noted, on sotalol For GFR less than 60 some discussion of sotalol daily QT interval within range  GFR 48 in June 2023, Now taking sotolol once a day Less dizzy on once a day Not on anticoagulation  Retinal tear, detached Required surgery Looks months to recover  Total chol 239 A1C 5.8  Sedentary, no exercise  he spends part of the year in [place]. Also plays golf for exercise  EKG personally reviewed by myself on todays visit AV paced rate 71 bpm  Other past medical history reviewed Former smoker 20 years  PMH:   has a past medical history of Acute idiopathic gout of right foot, Colon polyps, Constipation, Erectile dysfunction, Generalized abdominal mass, Hyperglycemia, Osteoarthritis, Paroxysmal atrial flutter ([place]), Raynaud's phenomenon, and Skin cancer.  PSH:    Past Surgical History:  Procedure Laterality Date   APPENDECTOMY     CATARACT EXTRACTION     COLONOSCOPY WITH PROPOFOL N/A 05/22/2015   Procedure: COLONOSCOPY WITH PROPOFOL;  Surgeon: Manya Silvas, MD;  Location: [facility] ENDOSCOPY;  Service: Endoscopy;  Laterality: N/A;   PACEMAKER IMPLANT N/A 02/25/2019   Procedure: PACEMAKER IMPLANT;  Surgeon: Deboraha Sprang, MD;  Location: [facility];  Service: Cardiovascular;   Laterality: N/A;   RETINAL DETACHMENT SURGERY     2023   skin grafts     TONSILLECTOMY      Current Outpatient Medications  Medication Sig Dispense Refill   Alpha-Lipoic Acid 300 MG CAPS Take by mouth.     Ascorbic Acid (VITAMIN C PO) Take 1 tablet by mouth daily.     b complex vitamins tablet Take 1 tablet by mouth daily.     BLACK PEPPER-TURMERIC PO Take 1 Dose by mouth daily.     cetirizine (ZYRTEC) 10 MG tablet Take 10 mg by mouth daily as needed for allergies.     diphenhydrAMINE (BENADRYL) 25 MG tablet Take 25 mg by mouth at bedtime as needed for sleep.     dutasteride (AVODART) 0.5 MG capsule Take 1 capsule (0.5 mg total) by mouth daily. 90 capsule 0   MAGNESIUM PO Take 1 tablet by mouth daily.     Multiple Minerals (MULTI-MINERALS) TABS Take 1 tablet by mouth daily.     Multiple Vitamins-Minerals (ZINC PO) Take 1 tablet by mouth daily.     rosuvastatin (CRESTOR) 10 MG tablet TAKE 1 TABLET BY MOUTH DAILY 90 tablet 1   sotalol (BETAPACE) 120 MG tablet Take 1 tablet (120 mg) by mouth once daily      tadalafil (CIALIS) 5 MG tablet Take 1 tablet (5 mg total) by mouth daily as needed for erectile dysfunction. 90 tablet 3   triamcinolone cream (KENALOG) 0.1 % APPLY TO THE AFFECTED AREA ON ARMS TWICE DAILY UNTIL CLEAR     VITAMIN  D PO Take 1 tablet by mouth daily.     VITAMIN E PO Take 1 capsule by mouth daily.     No current facility-administered medications for this visit.     Allergies:   Morphine and related, Nsaids, Other, and Phenergan [promethazine hcl]   Social History:  The patient  reports that he has quit smoking. His smoking use included cigarettes. He has never used smokeless tobacco. He reports current alcohol use. He reports that he does not use drugs.   Family History:   family history is not on file.    Review of Systems: Review of Systems  Constitutional: Negative.   Respiratory: Negative.    Cardiovascular:  Positive for palpitations.  Gastrointestinal:  Negative.   Musculoskeletal: Negative.   Neurological: Negative.   Psychiatric/Behavioral: Negative.    All other systems reviewed and are negative.   PHYSICAL EXAM: VS:  BP 122/80 (BP Location: Left Arm, Patient Position: Sitting, Cuff Size: Normal)   Pulse 71   Ht '6\' 1"'$  (1.854 m)   Wt 176 lb 6 oz (80 kg)   SpO2 99%   BMI 23.27 kg/m  , BMI Body mass index is 23.27 kg/m. Constitutional:  oriented to person, place, and time. No distress.  HENT:  Head: Grossly normal Eyes:  no discharge. No scleral icterus.  Neck: No JVD, no carotid bruits  Cardiovascular: Regular rate and rhythm, no murmurs appreciated Pulmonary/Chest: Clear to auscultation bilaterally, no wheezes or rails Abdominal: Soft.  no distension.  no tenderness.  Musculoskeletal: Normal range of motion Neurological:  normal muscle tone. Coordination normal. No atrophy Skin: Skin warm and dry Psychiatric: normal affect, pleasant  Recent Labs: No results found for requested labs within last 365 days.    Lipid Panel Lab Results  Component Value Date   CHOL 126 05/30/2019   HDL 44 05/30/2019   LDLCALC 51 05/30/2019   TRIG 154 (H) 05/30/2019      Wt Readings from Last 3 Encounters:  07/12/22 176 lb 6 oz (80 kg)  04/21/22 170 lb (77.1 kg)  03/08/22 171 lb (77.6 kg)    ASSESSMENT AND PLAN:  Paroxysmal atrial flutter (HCC) -  asymptomatic from his flutter On sotolol 120 daily, GFR <60  Family history of early CAD -  Back On crestor, was off briefly  Mixed hyperlipidemia Numbers high off crestor, now back on crestor 10  Essential hypertension Blood pressure is well controlled on today's visit. No changes made to the medications.  Elevated glucose We have encouraged continued exercise, careful diet management in an effort to lose weight.  Former smoker Likely with mild COPD, high risk for coronary disease stopped  PVC's (premature ventricular contractions) Not an active issue   Total encounter  time more than 30 minutes  Greater than 50% was spent in counseling and coordination of care with the patient   Orders Placed This Encounter  Procedures   EKG 12-Lead     Signed, Esmond Plants, M.D., Ph.D. 07/12/2022  [place], [place]

## 2022-07-12 ENCOUNTER — Ambulatory Visit: Payer: Medicare Other | Attending: Cardiovascular Disease | Admitting: Cardiovascular Disease

## 2022-07-12 ENCOUNTER — Encounter: Payer: Self-pay | Admitting: Cardiovascular Disease

## 2022-07-12 VITALS — BP 122/80 | HR 71 | Ht 73.0 in | Wt 176.4 lb

## 2022-07-12 DIAGNOSIS — I441 Atrioventricular block, second degree: Secondary | ICD-10-CM

## 2022-07-12 DIAGNOSIS — Z950 Presence of cardiac pacemaker: Secondary | ICD-10-CM

## 2022-07-12 DIAGNOSIS — I495 Sick sinus syndrome: Secondary | ICD-10-CM

## 2022-07-12 DIAGNOSIS — I4892 Unspecified atrial flutter: Secondary | ICD-10-CM | POA: Diagnosis not present

## 2022-07-12 DIAGNOSIS — I10 Essential (primary) hypertension: Secondary | ICD-10-CM

## 2022-07-12 DIAGNOSIS — E7800 Pure hypercholesterolemia, unspecified: Secondary | ICD-10-CM

## 2022-07-12 DIAGNOSIS — E782 Mixed hyperlipidemia: Secondary | ICD-10-CM

## 2022-07-12 NOTE — Patient Instructions (Signed)
Medication Instructions:  No changes  If you need a refill on your cardiac medications before your next appointment, please call your pharmacy.   Lab work: No new labs needed  Testing/Procedures: No new testing needed  Follow-Up: At [facility], you and your health needs are our priority.  As part of our continuing mission to provide you with exceptional heart care, we have created designated Provider Care Teams.  These Care Teams include your primary Cardiologist (physician) and Advanced Practice Providers (APPs -  Physician Assistants and Nurse Practitioners) who all work together to provide you with the care you need, when you need it.  You will need a follow up appointment in 12 months  Providers on your designated Care Team:   Christopher Berge, NP Ryan Dunn, PA-C Cadence Furth, PA-C  COVID-19 Vaccine Information can be found at: [email or web address] For questions related to vaccine distribution or appointments, please email [email or web address] or call [phone].   

## 2022-08-18 ENCOUNTER — Other Ambulatory Visit: Payer: Self-pay | Admitting: Cardiovascular Disease

## 2022-08-24 ENCOUNTER — Ambulatory Visit (INDEPENDENT_AMBULATORY_CARE_PROVIDER_SITE_OTHER): Payer: Medicare Other

## 2022-08-24 DIAGNOSIS — I441 Atrioventricular block, second degree: Secondary | ICD-10-CM | POA: Diagnosis not present

## 2022-08-24 LAB — CUP PACEART REMOTE DEVICE CHECK
Battery Remaining Longevity: 98 mo
Battery Voltage: 2.99 V
Brady Statistic AP VP Percent: 96.61 %
Brady Statistic AP VS Percent: 0 %
Brady Statistic AS VP Percent: 3.37 %
Brady Statistic AS VS Percent: 0.01 %
Brady Statistic RA Percent Paced: 96.59 %
Brady Statistic RV Percent Paced: 99.99 %
Date Time Interrogation Session: 20231129041122
Implantable Lead Connection Status: 753985
Implantable Lead Connection Status: 753985
Implantable Lead Implant Date: 20200601
Implantable Lead Implant Date: 20200601
Implantable Lead Location: 753859
Implantable Lead Location: 753860
Implantable Lead Model: 5076
Implantable Lead Model: 5076
Implantable Pulse Generator Implant Date: 20200601
Lead Channel Impedance Value: 285 Ohm
Lead Channel Impedance Value: 361 Ohm
Lead Channel Impedance Value: 380 Ohm
Lead Channel Impedance Value: 418 Ohm
Lead Channel Pacing Threshold Amplitude: 0.75 V
Lead Channel Pacing Threshold Amplitude: 0.75 V
Lead Channel Pacing Threshold Pulse Width: 0.4 ms
Lead Channel Pacing Threshold Pulse Width: 0.4 ms
Lead Channel Sensing Intrinsic Amplitude: 1.5 mV
Lead Channel Sensing Intrinsic Amplitude: 1.5 mV
Lead Channel Sensing Intrinsic Amplitude: 6.375 mV
Lead Channel Sensing Intrinsic Amplitude: 6.375 mV
Lead Channel Setting Pacing Amplitude: 1.5 V
Lead Channel Setting Pacing Amplitude: 2 V
Lead Channel Setting Pacing Pulse Width: 0.4 ms
Lead Channel Setting Sensing Sensitivity: 2 mV
Zone Setting Status: 755011
Zone Setting Status: 755011

## 2022-09-20 NOTE — Progress Notes (Signed)
Remote pacemaker transmission.   

## 2022-11-23 ENCOUNTER — Ambulatory Visit: Payer: Medicare Other

## 2022-11-23 DIAGNOSIS — I441 Atrioventricular block, second degree: Secondary | ICD-10-CM

## 2022-11-23 LAB — CUP PACEART REMOTE DEVICE CHECK
Battery Remaining Longevity: 95 mo
Battery Voltage: 2.99 V
Brady Statistic AP VP Percent: 92.9 %
Brady Statistic AP VS Percent: 0 %
Brady Statistic AS VP Percent: 7.06 %
Brady Statistic AS VS Percent: 0.04 %
Brady Statistic RA Percent Paced: 92.82 %
Brady Statistic RV Percent Paced: 99.95 %
Date Time Interrogation Session: 20240228040335
Implantable Lead Connection Status: 753985
Implantable Lead Connection Status: 753985
Implantable Lead Implant Date: 20200601
Implantable Lead Implant Date: 20200601
Implantable Lead Location: 753859
Implantable Lead Location: 753860
Implantable Lead Model: 5076
Implantable Lead Model: 5076
Implantable Pulse Generator Implant Date: 20200601
Lead Channel Impedance Value: 285 Ohm
Lead Channel Impedance Value: 361 Ohm
Lead Channel Impedance Value: 399 Ohm
Lead Channel Impedance Value: 418 Ohm
Lead Channel Pacing Threshold Amplitude: 0.625 V
Lead Channel Pacing Threshold Amplitude: 0.75 V
Lead Channel Pacing Threshold Pulse Width: 0.4 ms
Lead Channel Pacing Threshold Pulse Width: 0.4 ms
Lead Channel Sensing Intrinsic Amplitude: 1.375 mV
Lead Channel Sensing Intrinsic Amplitude: 1.375 mV
Lead Channel Sensing Intrinsic Amplitude: 6.375 mV
Lead Channel Sensing Intrinsic Amplitude: 6.375 mV
Lead Channel Setting Pacing Amplitude: 1.5 V
Lead Channel Setting Pacing Amplitude: 2 V
Lead Channel Setting Pacing Pulse Width: 0.4 ms
Lead Channel Setting Sensing Sensitivity: 2 mV
Zone Setting Status: 755011
Zone Setting Status: 755011

## 2022-12-27 NOTE — Progress Notes (Signed)
Remote pacemaker transmission.   

## 2023-02-22 ENCOUNTER — Ambulatory Visit (INDEPENDENT_AMBULATORY_CARE_PROVIDER_SITE_OTHER): Payer: Medicare Other

## 2023-02-22 DIAGNOSIS — I441 Atrioventricular block, second degree: Secondary | ICD-10-CM

## 2023-02-23 LAB — CUP PACEART REMOTE DEVICE CHECK
Battery Remaining Longevity: 92 mo
Battery Voltage: 2.98 V
Brady Statistic AP VP Percent: 97.14 %
Brady Statistic AP VS Percent: 0 %
Brady Statistic AS VP Percent: 2.82 %
Brady Statistic AS VS Percent: 0.03 %
Brady Statistic RA Percent Paced: 97.13 %
Brady Statistic RV Percent Paced: 99.96 %
Date Time Interrogation Session: 20240528214417
Implantable Lead Connection Status: 753985
Implantable Lead Connection Status: 753985
Implantable Lead Implant Date: 20200601
Implantable Lead Implant Date: 20200601
Implantable Lead Location: 753859
Implantable Lead Location: 753860
Implantable Lead Model: 5076
Implantable Lead Model: 5076
Implantable Pulse Generator Implant Date: 20200601
Lead Channel Impedance Value: 323 Ohm
Lead Channel Impedance Value: 380 Ohm
Lead Channel Impedance Value: 399 Ohm
Lead Channel Impedance Value: 437 Ohm
Lead Channel Pacing Threshold Amplitude: 0.625 V
Lead Channel Pacing Threshold Amplitude: 0.75 V
Lead Channel Pacing Threshold Pulse Width: 0.4 ms
Lead Channel Pacing Threshold Pulse Width: 0.4 ms
Lead Channel Sensing Intrinsic Amplitude: 1.375 mV
Lead Channel Sensing Intrinsic Amplitude: 1.375 mV
Lead Channel Sensing Intrinsic Amplitude: 6.375 mV
Lead Channel Sensing Intrinsic Amplitude: 6.375 mV
Lead Channel Setting Pacing Amplitude: 1.5 V
Lead Channel Setting Pacing Amplitude: 2 V
Lead Channel Setting Pacing Pulse Width: 0.4 ms
Lead Channel Setting Sensing Sensitivity: 2 mV
Zone Setting Status: 755011
Zone Setting Status: 755011

## 2023-02-27 ENCOUNTER — Other Ambulatory Visit: Payer: Self-pay | Admitting: Internal Medicine

## 2023-02-27 DIAGNOSIS — R202 Paresthesia of skin: Secondary | ICD-10-CM

## 2023-02-27 DIAGNOSIS — M542 Cervicalgia: Secondary | ICD-10-CM

## 2023-02-27 DIAGNOSIS — R519 Headache, unspecified: Secondary | ICD-10-CM

## 2023-02-27 DIAGNOSIS — R531 Weakness: Secondary | ICD-10-CM

## 2023-03-02 ENCOUNTER — Ambulatory Visit
Admission: RE | Admit: 2023-03-02 | Discharge: 2023-03-02 | Disposition: A | Discharge status: Home / Self Care | Payer: Medicare Other | Source: Ambulatory Visit | Attending: Internal Medicine | Admitting: Internal Medicine

## 2023-03-02 DIAGNOSIS — R519 Headache, unspecified: Secondary | ICD-10-CM | POA: Insufficient documentation

## 2023-03-02 DIAGNOSIS — M542 Cervicalgia: Secondary | ICD-10-CM | POA: Diagnosis present

## 2023-03-02 DIAGNOSIS — R531 Weakness: Secondary | ICD-10-CM | POA: Insufficient documentation

## 2023-03-02 DIAGNOSIS — R202 Paresthesia of skin: Secondary | ICD-10-CM | POA: Insufficient documentation

## 2023-03-16 NOTE — Progress Notes (Signed)
Remote pacemaker transmission.   

## 2023-03-21 ENCOUNTER — Ambulatory Visit: Payer: Medicare Other | Attending: Internal Medicine | Admitting: Internal Medicine

## 2023-03-21 ENCOUNTER — Encounter: Payer: Self-pay | Admitting: Internal Medicine

## 2023-03-21 VITALS — BP 117/66 | HR 62 | Ht 73.0 in | Wt 168.4 lb

## 2023-03-21 DIAGNOSIS — R42 Dizziness and giddiness: Secondary | ICD-10-CM

## 2023-03-21 DIAGNOSIS — I495 Sick sinus syndrome: Secondary | ICD-10-CM

## 2023-03-21 DIAGNOSIS — R001 Bradycardia, unspecified: Secondary | ICD-10-CM

## 2023-03-21 DIAGNOSIS — Z950 Presence of cardiac pacemaker: Secondary | ICD-10-CM | POA: Diagnosis not present

## 2023-03-21 NOTE — Patient Instructions (Addendum)
Medication Instructions:  Your physician recommends that you continue on your current medications as directed. Please refer to the Current Medication list given to you today.  *If you need a refill on your cardiac medications before your next appointment, please call your pharmacy*   Lab Work: Request provided for PCP to draw Mg level If you have labs (blood work) drawn today and your tests are completely normal, you will receive your results only by: MyChart Message (if you have MyChart) OR A paper copy in the mail If you have any lab test that is abnormal or we need to change your treatment, we will call you to review the results.   Testing/Procedures: None ordered.    Follow-Up: At [facility], you and your health needs are our priority.  As part of our continuing mission to provide you with exceptional heart care, we have created designated Provider Care Teams.  These Care Teams include your primary Cardiologist (physician) and Advanced Practice Providers (APPs -  Physician Assistants and Nurse Practitioners) who all work together to provide you with the care you need, when you need it.  We recommend signing up for the patient portal called "MyChart".  Sign up information is provided on this After Visit Summary.  MyChart is used to connect with patients for Virtual Visits (Telemedicine).  Patients are able to view lab/test results, encounter notes, upcoming appointments, etc.  Non-urgent messages can be sent to your provider as well.   To learn more about what you can do with MyChart, go to [email or web address].    Your next appointment:   12 months with Dr Graciela Husbands

## 2023-03-21 NOTE — Progress Notes (Signed)
Patient Care Team: Lynnea Ferrier, MD as PCP - General (Internal Medicine) Duke Salvia, MD as PCP - Cardiology (Cardiology)   HPI  Kerry Rowland is a 87 y.o. 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  Hx of atrial flutter and fib dating back about 20 yrs.  Had been treated with sotalol and without anticoagulation.  Interval development of complete heart block  The patient denies chest pain, shortness of breath, nocturnal dyspnea, orthopnea or peripheral edema.  There have been no palpitations, or syncope.  Complains of orthostatic LH, not in shower.    Date Cr K GFR CKD Hgb Mg  5/20 0.97 4.5  13.6 2.1 (9/20)  6/21 1.2 4.2  13.6 2.1 (CE)   10/22 1.3 4.5 54 13.5   5/24 1.3 4.6 54 14.3       DATE TEST EF   5/20 Echo   50-55 %   5/22 CorCTA  CaScore 752               Past Medical History:  Diagnosis Date   Acute idiopathic gout of right foot    Colon polyps    Constipation    Erectile dysfunction    Generalized abdominal mass    Hyperglycemia    Osteoarthritis    Paroxysmal atrial flutter (HCC)    Raynaud's phenomenon    Skin cancer     Past Surgical History:  Procedure Laterality Date   APPENDECTOMY     CATARACT EXTRACTION     COLONOSCOPY WITH PROPOFOL N/A 05/22/2015   Procedure: COLONOSCOPY WITH PROPOFOL;  Surgeon: Scot Jun, MD;  [facility] ENDOSCOPY;  Service: Endoscopy;  Laterality: N/A;   PACEMAKER IMPLANT N/A 02/25/2019   Procedure: PACEMAKER IMPLANT;  Surgeon: Duke Salvia, MD;  Location[facility] INVASIVE CV LAB;  Service: Cardiovascular;  Laterality: N/A;   RETINAL DETACHMENT SURGERY     2023   skin grafts     TONSILLECTOMY     Current Meds  Medication Sig   Alpha-Lipoic Acid 300 MG CAPS Take by mouth.   Ascorbic Acid (VITAMIN C PO) Take 1 tablet by mouth daily.   b complex vitamins tablet Take 1 tablet by mouth daily.   BLACK PEPPER-TURMERIC PO Take 1 Dose by mouth daily.   cetirizine (ZYRTEC) 10  MG tablet Take 10 mg by mouth daily as needed for allergies.   dutasteride (AVODART) 0.5 MG capsule Take 1 capsule (0.5 mg total) by mouth daily.   MAGNESIUM PO Take 1 tablet by mouth daily.   Multiple Minerals (MULTI-MINERALS) TABS Take 1 tablet by mouth daily.   Multiple Vitamins-Minerals (ZINC PO) Take 1 tablet by mouth daily.   sotalol (BETAPACE) 120 MG tablet Take 1 tablet (120 mg) by mouth once daily    tadalafil (CIALIS) 5 MG tablet Take 1 tablet (5 mg total) by mouth daily as needed for erectile dysfunction.   triamcinolone cream (KENALOG) 0.1 % APPLY TO THE AFFECTED AREA ON ARMS TWICE DAILY UNTIL CLEAR   VITAMIN D PO Take 1 tablet by mouth daily.   VITAMIN E PO Take 1 capsule by mouth daily.     Allergies  Allergen Reactions   Cashew Nut Oil Hives   Macadamia Nut Oil Hives   Pecan Nut (Diagnostic) Hives   Pistachio Nut (Diagnostic) Hives   Morphine And Codeine Other (See Comments)    Chills, felt cold    Nsaids Other (See Comments)  intestinal bleeding   Other     Nightshade plants - inflammation pain   Phenergan [Promethazine Hcl]     Uncontrolled muscle movement       Review of Systems negative except from HPI and PMH  Physical Exam BP 117/66 (Patient Position: Standing)   Pulse 62   Ht 6\' 1"  (1.854 m)   Wt 168 lb 6.4 oz (76.4 kg)   SpO2 98%   BMI 22.22 kg/m  Well developed and well nourished in no acute distress HENT normal Neck supple with JVP-flat Clear Device pocket well healed; without hematoma or erythema.  There is no tethering  Regular rate and rhythm, no  gallop No / murmur Abd-soft with active BS No Clubbing cyanosis  edema Skin-warm and dry A & Oriented  Grossly normal sensory and motor function  ECG AV pacing at 78  Device function is normal. Programming changes increase in his upper sensor rate to 110  see Paceart for details    Office walk test heart rate 84  Arrhythmias./fibrillation    Sinus bradycardia-chronotropic  incompetence  Pacemaker Medtronic   AV block complete  Orthostatic lightheadedness   High Risk Medication Surveillance sotalol  Hyperlipidemia  Renal insufficiency grade 3A  Coronary artery calcification   Some tachyarrhythmia noted on his phone 240; device is programmed at 170 nothing detected Continue sotalol 120 daily.  Needs magnesium level checked we will have him do this with his primary care provider  Orthostatic lightheadedness.  Reviewed nonpharmacological maneuvers  The patient is no longer taking Crestor.  He stopped it on his own.  Was concerned about side effects.  Using the calculator from the [facility] a little bit of range, upper age limit is 59, is optimization risk) about 27% 18% (a standard 30% reduction) which translates to less than 1 %/year.  He remains disinclined to resume it Have recommended ASA 81

## 2023-04-24 NOTE — Progress Notes (Unsigned)
04/25/23 11:16 AM   Kerry Rowland [birth year] [id number]  Referring provider:  Lynnea Ferrier, MD [address] [facility] [place],  [place] [address]  Chief Complaint  Patient presents with   Benign Prostatic Hypertrophy    Urological history  1. BPH with LU TS -managed with dutasteride 0.5 mg weekly and tadalafil 5 mg daily   2. ED -contributing factors of age, testosterone deficiency, HLD, HTN and former smoker -no longer sexually active    3. Testosterone deficiency -testosterone therapy did not provide benefit   HPI: Kerry Rowland is a 87 y.o.male who presents today for a one year follow-up.   Previous records reviewed.    I PSS 7/1  He is taking Avodart 0.5 mg once weekly and tadalafil 5 mg weekly.  Patient denies any modifying or aggravating factors.  Patient denies any recent UTI's, gross hematuria, dysuria or suprapubic/flank pain.  Patient denies any fevers, chills, nausea or vomiting.   He continues to have issues with ED.  He does not have spontaneous erections.  He does not have pain or curvature with erections.    IPSS     Kerry Rowland 04/25/23 1000         International Prostate Symptom Score   How often have you had the sensation of not emptying your bladder? Not at All     How often have you had to urinate less than every two hours? Less than 1 in 5 times     How often have you found you stopped and started again several times when you urinated? Less than half the time     How often have you found it difficult to postpone urination? Not at All     How often have you had a weak urinary stream? Less than half the time     How often have you had to strain to start urination? Less than half the time     How many times did you typically get up at night to urinate? None     Total IPSS Score 7       Quality of Life due to urinary symptoms   If you were to spend the rest of your life with your urinary condition just the way it is now how  would you feel about that? Pleased              IPSS     Kerry Rowland 04/25/23 1000         International Prostate Symptom Score   How often have you had the sensation of not emptying your bladder? Not at All     How often have you had to urinate less than every two hours? Less than 1 in 5 times     How often have you found you stopped and started again several times when you urinated? Less than half the time     How often have you found it difficult to postpone urination? Not at All     How often have you had a weak urinary stream? Less than half the time     How often have you had to strain to start urination? Less than half the time     How many times did you typically get up at night to urinate? None     Total IPSS Score 7       Quality of Life due to urinary symptoms   If you were to spend the rest  of your life with your urinary condition just the way it is now how would you feel about that? Pleased             Score:  1-7 Mild 8-19 Moderate 20-35 Severe   PMH: Past Medical History:  Diagnosis Date   Acute idiopathic gout of right foot    Colon polyps    Constipation    Erectile dysfunction    Generalized abdominal mass    Hyperglycemia    Osteoarthritis    Paroxysmal atrial flutter (HCC)    Raynaud's phenomenon    Skin cancer     Surgical History: Past Surgical History:  Procedure Laterality Date   APPENDECTOMY     CATARACT EXTRACTION     COLONOSCOPY WITH PROPOFOL N/A 05/22/2015   Procedure: COLONOSCOPY WITH PROPOFOL;  Surgeon: Scot Jun, MD;  Location: [facility] ENDOSCOPY;  Service: Endoscopy;  Laterality: N/A;   PACEMAKER IMPLANT N/A 02/25/2019   Procedure: PACEMAKER IMPLANT;  Surgeon: Duke Salvia, MD;  Location: [facility] INVASIVE CV LAB;  Service: Cardiovascular;  Laterality: N/A;   RETINAL DETACHMENT SURGERY     2023   skin grafts     TONSILLECTOMY      Home Medications:  Allergies as of 04/25/2023       Reactions   Cashew Nut Oil Hives    Macadamia Nut Oil Hives   Pecan Nut (diagnostic) Hives   Pistachio Nut (diagnostic) Hives   Morphine And Codeine Other (See Comments)   Chills, felt cold    Nsaids Other (See Comments)   intestinal bleeding   Other    Nightshade plants - inflammation pain   Phenergan [promethazine Hcl]    Uncontrolled muscle movement         Medication List        Accurate as of April 25, 2023 11:16 AM. If you have any questions, ask your nurse or doctor.          Alpha-Lipoic Acid 300 MG Caps Take by mouth.   b complex vitamins tablet Take 1 tablet by mouth daily.   BLACK PEPPER-TURMERIC PO Take 1 Dose by mouth daily.   cetirizine 10 MG tablet Commonly known as: ZYRTEC Take 10 mg by mouth daily as needed for allergies.   dutasteride 0.5 MG capsule Commonly known as: AVODART Take 1 capsule (0.5 mg total) by mouth daily.   MAGNESIUM PO Take 1 tablet by mouth daily.   Multi-Minerals Tabs Take 1 tablet by mouth daily.   sotalol 120 MG tablet Commonly known as: BETAPACE Take 1 tablet (120 mg) by mouth once daily   tadalafil 20 MG tablet Commonly known as: CIALIS Take 1 tablet (20 mg total) by mouth daily as needed for erectile dysfunction. What changed: You were already taking a medication with the same Rowland, and this prescription was added. Make sure you understand how and when to take each. Changed by: Michiel Cowboy   tadalafil 5 MG tablet Commonly known as: CIALIS Take 1 tablet (5 mg total) by mouth daily as needed for erectile dysfunction. What changed: Another medication with the same Rowland was added. Make sure you understand how and when to take each. Changed by: Carollee Herter Lycan Davee   triamcinolone cream 0.1 % Commonly known as: KENALOG APPLY TO THE AFFECTED AREA ON ARMS TWICE DAILY UNTIL CLEAR   VITAMIN C PO Take 1 tablet by mouth daily.   VITAMIN D PO Take 1 tablet by mouth daily.   VITAMIN E PO Take 1 capsule  by mouth daily.   ZINC PO Take 1 tablet by  mouth daily.        Allergies:  Allergies  Allergen Reactions   Cashew Nut Oil Hives   Macadamia Nut Oil Hives   Pecan Nut (Diagnostic) Hives   Pistachio Nut (Diagnostic) Hives   Morphine And Codeine Other (See Comments)    Chills, felt cold    Nsaids Other (See Comments)    intestinal bleeding   Other     Nightshade plants - inflammation pain   Phenergan [Promethazine Hcl]     Uncontrolled muscle movement     Family History: Family History  Problem Relation Age of Onset   Kidney disease Neg Hx    Prostate cancer Neg Hx    Kidney cancer Neg Hx    Bladder Cancer Neg Hx     Social History:  reports that he has quit smoking. His smoking use included cigarettes. He has never used smokeless tobacco. He reports current alcohol use. He reports that he does not use drugs.   Physical Exam: BP 133/79   Pulse 71   Ht 6\' 1"  (1.854 m)   Wt 163 lb (73.9 kg)   BMI 21.51 kg/m   Constitutional:  Well nourished. Alert and oriented, No acute distress. HEENT: [place] AT, moist mucus membranes.  Trachea midline Cardiovascular: No clubbing, cyanosis, or edema. Respiratory: Normal respiratory effort, no increased work of breathing. Neurologic: Grossly intact, no focal deficits, moving all 4 extremities. Psychiatric: Normal mood and affect.   Laboratory Data: Comprehensive Metabolic Panel (CMP) Order: [id number] Component Ref Range & Units 4 wk ago Glucose 70 - 110 mg/dL 95 Sodium 409 - 811 mmol/L 139 Potassium 3.6 - 5.1 mmol/L 4.3 Chloride 97 - 109 mmol/L 104 Carbon Dioxide (CO2) 22.0 - 32.0 mmol/L 27.7 Urea Nitrogen (BUN) 7 - 25 mg/dL 29 High  Creatinine 0.7 - 1.3 mg/dL 1.3 Glomerular Filtration Rate (eGFR) >60 mL/min/1.73sq m 54 Low  Comment: CKD-EPI (2021) does not include patient's race in the calculation of eGFR.  Monitoring changes of plasma creatinine and eGFR over time is useful for monitoring kidney function.  Interpretive Ranges for eGFR (CKD-EPI  2021):  eGFR:       >60 mL/min/1.73 sq. m - Normal eGFR:       30-59 mL/min/1.73 sq. m - Moderately Decreased eGFR:       15-29 mL/min/1.73 sq. m  - Severely Decreased eGFR:       < 15 mL/min/1.73 sq. m  - Kidney Failure   Note: These eGFR calculations do not apply in acute situations when eGFR is changing rapidly or patients on dialysis. Calcium 8.7 - 10.3 mg/dL 9.7 AST 8 - 39 U/L 17 ALT 6 - 57 U/L 14 Alk Phos (alkaline Phosphatase) 34 - 104 U/L 53 Albumin 3.5 - 4.8 g/dL 4.5 Bilirubin, Total 0.3 - 1.2 mg/dL 0.9 Protein, Total 6.1 - 7.9 g/dL 6.9 A/G Ratio 1.0 - 5.0 gm/dL 1.9 Resulting Agency [facility]  Specimen Collected: 03/27/23 08:34 Performed by: [facility] Last Resulted: 03/27/23 11:57 Received From: [facility]  Result Received: 03/29/23 13:57   Hemoglobin A1C Order: [id number] Component Ref Range & Units 4 wk ago Hemoglobin A1C 4.2 - 5.6 % 5.7 High  Average Blood Glucose (Calc) mg/dL 213 Resulting Agency [facility] Narrative Performed by [facility] Normal Range:    4.2 - 5.6% Increased Risk:  5.7 - 6.4% Diabetes:        >=  6.5% Glycemic Control for adults with diabetes:  <7%    Specimen Collected: 03/27/23 08:34 Performed by: [facility] Last Resulted: 03/27/23 10:46 Received From: [facility]  Result Received: 03/29/23 13:57   Urinalysis w/Microscopic Order: [id number] Component Ref Range & Units 4 wk ago Color Colorless, Straw, Light Yellow, Yellow, Dark Yellow Yellow Clarity Clear Clear Specific Gravity 1.005 - 1.030 1.013 pH, Urine 5.0 - 8.0 6.0 Protein, Urinalysis Negative mg/dL Negative Glucose, Urinalysis Negative mg/dL Negative Ketones, Urinalysis Negative mg/dL Negative Blood, Urinalysis Negative Negative Nitrite, Urinalysis Negative Negative Leukocyte Esterase,  Urinalysis Negative Negative Bilirubin, Urinalysis Negative Negative Urobilinogen, Urinalysis 0.2 - 1.0 mg/dL 0.2 WBC, UA <=5 /hpf 0 Red Blood Cells, Urinalysis <=3 /hpf 1 Bacteria, Urinalysis 0 - 5 /hpf 0-5 Squamous Epithelial Cells, Urinalysis /hpf 0 Resulting Agency [facility]  Specimen Collected: 03/27/23 08:34 Performed by: [facility] Last Resulted: 03/27/23 08:51 Received From: [facility]  Result Received: 03/29/23 13:57  Uric Acid Order: [id number] Component Ref Range & Units 4 wk ago Uric Acid 4.4 - 7.6 mg/dL 7.7 High  Resulting Agency [facility] - [facility]  Specimen Collected: 03/27/23 08:34 Performed by: [facility] Last Resulted: 03/27/23 11:57 Received From: [facility]  Result Received: 03/29/23 13:57 I have reviewed the labs.   Assessment & Plan:    BPH with LUTS  -at goal with meds -Continue tadalafil  5 mg daily  - continue dutasteride 0.5 mg weekly, may go to every two weeks   2. History of gross hematuria  - Patient denies any episode of gross hematuria   3. ED -Patient will to continue the tadalafil 5 mg daily and he will augment with tadalafil 20 mg on-demand dosing for days he wants to have intercourse   Return in about 1 year (around 04/24/2024) for IPSS, SHIM .   Cloretta Ned   [facility] [address], [address] [place], [place] [address] [phone]

## 2023-04-25 ENCOUNTER — Ambulatory Visit: Payer: Medicare Other | Admitting: Urology

## 2023-04-25 ENCOUNTER — Encounter: Payer: Self-pay | Admitting: Urology

## 2023-04-25 VITALS — BP 133/79 | HR 71 | Ht 73.0 in | Wt 163.0 lb

## 2023-04-25 DIAGNOSIS — N529 Male erectile dysfunction, unspecified: Secondary | ICD-10-CM

## 2023-04-25 DIAGNOSIS — N401 Enlarged prostate with lower urinary tract symptoms: Secondary | ICD-10-CM | POA: Diagnosis not present

## 2023-04-25 DIAGNOSIS — R319 Hematuria, unspecified: Secondary | ICD-10-CM | POA: Diagnosis not present

## 2023-04-25 MED ORDER — TADALAFIL 20 MG PO TABS
20.0000 mg | ORAL_TABLET | Freq: Every day | ORAL | 3 refills | Status: DC | PRN
Start: 2023-04-25 — End: 2024-02-12

## 2023-04-25 MED ORDER — TADALAFIL 5 MG PO TABS
5.0000 mg | ORAL_TABLET | Freq: Every day | ORAL | 3 refills | Status: AC | PRN
Start: 2023-04-25 — End: ?

## 2023-05-13 ENCOUNTER — Other Ambulatory Visit: Payer: Self-pay | Admitting: Urology

## 2023-05-13 DIAGNOSIS — N138 Other obstructive and reflux uropathy: Secondary | ICD-10-CM

## 2023-05-24 ENCOUNTER — Ambulatory Visit (INDEPENDENT_AMBULATORY_CARE_PROVIDER_SITE_OTHER): Payer: Medicare Other

## 2023-05-24 DIAGNOSIS — I495 Sick sinus syndrome: Secondary | ICD-10-CM

## 2023-05-24 LAB — CUP PACEART REMOTE DEVICE CHECK
Battery Remaining Longevity: 88 mo
Battery Voltage: 2.98 V
Brady Statistic AP VP Percent: 96.53 %
Brady Statistic AP VS Percent: 0 %
Brady Statistic AS VP Percent: 3.46 %
Brady Statistic AS VS Percent: 0.01 %
Brady Statistic RA Percent Paced: 96.5 %
Brady Statistic RV Percent Paced: 99.99 %
Date Time Interrogation Session: 20240827225718
Implantable Lead Connection Status: 753985
Implantable Lead Connection Status: 753985
Implantable Lead Implant Date: 20200601
Implantable Lead Implant Date: 20200601
Implantable Lead Location: 753859
Implantable Lead Location: 753860
Implantable Lead Model: 5076
Implantable Lead Model: 5076
Implantable Pulse Generator Implant Date: 20200601
Lead Channel Impedance Value: 285 Ohm
Lead Channel Impedance Value: 361 Ohm
Lead Channel Impedance Value: 399 Ohm
Lead Channel Impedance Value: 418 Ohm
Lead Channel Pacing Threshold Amplitude: 0.625 V
Lead Channel Pacing Threshold Amplitude: 0.75 V
Lead Channel Pacing Threshold Pulse Width: 0.4 ms
Lead Channel Pacing Threshold Pulse Width: 0.4 ms
Lead Channel Sensing Intrinsic Amplitude: 1.5 mV
Lead Channel Sensing Intrinsic Amplitude: 1.5 mV
Lead Channel Sensing Intrinsic Amplitude: 8.75 mV
Lead Channel Sensing Intrinsic Amplitude: 8.75 mV
Lead Channel Setting Pacing Amplitude: 1.5 V
Lead Channel Setting Pacing Amplitude: 2 V
Lead Channel Setting Pacing Pulse Width: 0.4 ms
Lead Channel Setting Sensing Sensitivity: 2 mV
Zone Setting Status: 755011
Zone Setting Status: 755011

## 2023-06-02 NOTE — Progress Notes (Signed)
Remote pacemaker transmission.   

## 2023-08-23 ENCOUNTER — Ambulatory Visit (INDEPENDENT_AMBULATORY_CARE_PROVIDER_SITE_OTHER): Payer: Medicare Other

## 2023-08-23 DIAGNOSIS — I495 Sick sinus syndrome: Secondary | ICD-10-CM

## 2023-08-23 LAB — CUP PACEART REMOTE DEVICE CHECK
Battery Remaining Longevity: 84 mo
Battery Voltage: 2.98 V
Brady Statistic AP VP Percent: 96.72 %
Brady Statistic AP VS Percent: 0 %
Brady Statistic AS VP Percent: 3.27 %
Brady Statistic AS VS Percent: 0.01 %
Brady Statistic RA Percent Paced: 96.69 %
Brady Statistic RV Percent Paced: 99.99 %
Date Time Interrogation Session: 20241127021951
Implantable Lead Connection Status: 753985
Implantable Lead Connection Status: 753985
Implantable Lead Implant Date: 20200601
Implantable Lead Implant Date: 20200601
Implantable Lead Location: 753859
Implantable Lead Location: 753860
Implantable Lead Model: 5076
Implantable Lead Model: 5076
Implantable Pulse Generator Implant Date: 20200601
Lead Channel Impedance Value: 285 Ohm
Lead Channel Impedance Value: 361 Ohm
Lead Channel Impedance Value: 380 Ohm
Lead Channel Impedance Value: 418 Ohm
Lead Channel Pacing Threshold Amplitude: 0.625 V
Lead Channel Pacing Threshold Amplitude: 0.75 V
Lead Channel Pacing Threshold Pulse Width: 0.4 ms
Lead Channel Pacing Threshold Pulse Width: 0.4 ms
Lead Channel Sensing Intrinsic Amplitude: 1.375 mV
Lead Channel Sensing Intrinsic Amplitude: 1.375 mV
Lead Channel Sensing Intrinsic Amplitude: 8.75 mV
Lead Channel Sensing Intrinsic Amplitude: 8.75 mV
Lead Channel Setting Pacing Amplitude: 1.5 V
Lead Channel Setting Pacing Amplitude: 2 V
Lead Channel Setting Pacing Pulse Width: 0.4 ms
Lead Channel Setting Sensing Sensitivity: 2 mV
Zone Setting Status: 755011
Zone Setting Status: 755011

## 2023-11-22 ENCOUNTER — Ambulatory Visit (INDEPENDENT_AMBULATORY_CARE_PROVIDER_SITE_OTHER): Payer: Medicare Other

## 2023-11-22 DIAGNOSIS — I495 Sick sinus syndrome: Secondary | ICD-10-CM

## 2023-11-23 LAB — CUP PACEART REMOTE DEVICE CHECK
Battery Remaining Longevity: 80 mo
Battery Voltage: 2.97 V
Brady Statistic AP VP Percent: 95.11 %
Brady Statistic AP VS Percent: 0 %
Brady Statistic AS VP Percent: 4.86 %
Brady Statistic AS VS Percent: 0.03 %
Brady Statistic RA Percent Paced: 95.09 %
Brady Statistic RV Percent Paced: 99.97 %
Date Time Interrogation Session: 20250225201520
Implantable Lead Connection Status: 753985
Implantable Lead Connection Status: 753985
Implantable Lead Implant Date: 20200601
Implantable Lead Implant Date: 20200601
Implantable Lead Location: 753859
Implantable Lead Location: 753860
Implantable Lead Model: 5076
Implantable Lead Model: 5076
Implantable Pulse Generator Implant Date: 20200601
Lead Channel Impedance Value: 285 Ohm
Lead Channel Impedance Value: 361 Ohm
Lead Channel Impedance Value: 399 Ohm
Lead Channel Impedance Value: 418 Ohm
Lead Channel Pacing Threshold Amplitude: 0.625 V
Lead Channel Pacing Threshold Amplitude: 0.75 V
Lead Channel Pacing Threshold Pulse Width: 0.4 ms
Lead Channel Pacing Threshold Pulse Width: 0.4 ms
Lead Channel Sensing Intrinsic Amplitude: 2.25 mV
Lead Channel Sensing Intrinsic Amplitude: 2.25 mV
Lead Channel Sensing Intrinsic Amplitude: 6.125 mV
Lead Channel Sensing Intrinsic Amplitude: 6.125 mV
Lead Channel Setting Pacing Amplitude: 1.5 V
Lead Channel Setting Pacing Amplitude: 2 V
Lead Channel Setting Pacing Pulse Width: 0.4 ms
Lead Channel Setting Sensing Sensitivity: 2 mV
Zone Setting Status: 755011
Zone Setting Status: 755011

## 2023-12-12 ENCOUNTER — Encounter: Payer: Self-pay | Admitting: Internal Medicine

## 2023-12-26 NOTE — Progress Notes (Signed)
 Remote pacemaker transmission.

## 2023-12-26 NOTE — Addendum Note (Signed)
 Addended by: Elease Etienne A on: 12/26/2023 01:03 PM   Modules accepted: Orders

## 2024-02-06 NOTE — Progress Notes (Signed)
 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 Coronary calcium  score in 03/2019 of 752. This was 19 percentile for age and sex matched control. Who presents for follow-up of his paroxysmal atrial flutter, bradycardia, palpitations  Last seen by myself in clinic 10/23 Seen by Dr. Rodolfo Rowland June 2024  In follow-up today reports doing well No regular exercise program Sometimes plays table tennis, little bit of walking, not much  Off crestor , read about side effects Reports he discussed this with Kerry Rowland, was told to take aspirin On aspirin daily had bruising started taking aspirin every other day  For GFR less than 60, taking sotalol  daily QT interval within range GFR 48 in June 2023, Less dizzy on once a day Not on anticoagulation  Lab work reviewed Total chol 217, off Crestor  for A1C 5.7  EKG personally reviewed by myself on todays visit EKG Interpretation Date/Time:  Monday Feb 12 2024 08:08:18 EDT Ventricular Rate:  63 PR Interval:    QRS Duration:  206 QT Interval:  520 QTC Calculation: 532 R Axis:   -58  Text Interpretation: AV dual-paced rhythm When compared with ECG of 21-Mar-2023 09:05, Vent. rate has decreased BY  15 BPM Confirmed by Kerry Rowland [phone]) on 02/12/2024 8:15:35 AM   Other past medical history reviewed Former smoker 20 years  PMH:   has a past medical history of Acute idiopathic gout of right foot, Colon polyps, Constipation, Erectile dysfunction, Generalized abdominal mass, Hyperglycemia, Osteoarthritis, Paroxysmal atrial flutter  (HCC), Raynaud's phenomenon, and Skin cancer.  PSH:    Past Surgical History:  Procedure Laterality Date   APPENDECTOMY     CATARACT EXTRACTION     COLONOSCOPY WITH PROPOFOL  N/A 05/22/2015   Procedure: COLONOSCOPY WITH PROPOFOL ;  Surgeon: Kerry Click, MD;  Location: [facility] ENDOSCOPY;  Service: Endoscopy;  Laterality: N/A;   PACEMAKER IMPLANT N/A 02/25/2019   Procedure: PACEMAKER IMPLANT;  Surgeon: Kerry Goodwill, MD;  Location: [facility] INVASIVE CV LAB;  Service: Cardiovascular;  Laterality: N/A;   RETINAL DETACHMENT SURGERY     2023   skin grafts     TONSILLECTOMY      Current Outpatient Medications  Medication Sig Dispense Refill   Alpha-Lipoic Acid 300 MG CAPS Take by mouth.     Ascorbic Acid (VITAMIN C PO) Take 1 tablet by mouth daily.     aspirin EC 81 MG tablet Take 81 mg by mouth every other day.     b complex vitamins tablet Take 1 tablet by mouth daily.     BLACK PEPPER-TURMERIC PO Take 1 Dose by mouth daily.     cetirizine (ZYRTEC) 10 MG tablet Take 10 mg by mouth daily as needed for allergies.     dutasteride  (AVODART ) 0.5 MG capsule TAKE ONE CAPSULE BY MOUTH ONE TIME DAILY 90 capsule 0   MAGNESIUM PO Take 1 tablet by mouth daily.     Multiple Minerals (MULTI-MINERALS) TABS Take 1 tablet by mouth daily.     Multiple Vitamins-Minerals (ZINC PO) Take 1 tablet by mouth daily.  sotalol  (BETAPACE ) 120 MG tablet Take 1 tablet (120 mg) by mouth once daily      tadalafil  (CIALIS ) 5 MG tablet Take 1 tablet (5 mg total) by mouth daily as needed for erectile dysfunction. 90 tablet 3   triamcinolone cream (KENALOG) 0.1 % APPLY TO THE AFFECTED AREA ON ARMS TWICE DAILY UNTIL CLEAR     VITAMIN D PO Take 1 tablet by mouth daily.     VITAMIN E PO Take 1 capsule by mouth daily.     No current facility-administered medications for this visit.    Allergies:   Cashew nut oil, Macadamia nut oil, Peanut (diagnostic), Pecan nut (diagnostic), Pistachio nut (diagnostic), Morphine and codeine,  Nsaids, Other, and Phenergan [promethazine hcl]   Social History:  The patient  reports that he has quit smoking. His smoking use included cigarettes. He has never used smokeless tobacco. He reports current alcohol use. He reports that he does not use drugs.   Family History:   family history is not on file.   Review of Systems: Review of Systems  Constitutional: Negative.   HENT: Negative.    Respiratory: Negative.    Cardiovascular: Negative.   Gastrointestinal: Negative.   Musculoskeletal: Negative.   Neurological: Negative.   Psychiatric/Behavioral: Negative.    All other systems reviewed and are negative.   PHYSICAL EXAM: VS:  BP (!) 140/80 (BP Location: Left Arm, Patient Position: Sitting, Cuff Size: Normal)   Pulse 63   Ht 6\' 1"  (1.854 m)   Wt 164 lb 6 oz (74.6 kg)   SpO2 98%   BMI 21.69 kg/m  , BMI Body mass index is 21.69 kg/m. Constitutional:  oriented to person, place, and time. No distress.  HENT:  Head: Grossly normal Eyes:  no discharge. No scleral icterus.  Neck: No JVD, no carotid bruits  Cardiovascular: Regular rate and rhythm, no murmurs appreciated Pulmonary/Chest: Clear to auscultation bilaterally, no wheezes or rales Abdominal: Soft.  no distension.  no tenderness.  Musculoskeletal: Normal range of motion Neurological:  normal muscle tone. Coordination normal. No atrophy Skin: Skin warm and dry Psychiatric: normal affect, pleasant  Recent Labs: No results found for requested labs within last 365 days.    Lipid Panel Lab Results  Component Value Date   CHOL 126 05/30/2019   HDL 44 05/30/2019   LDLCALC 51 05/30/2019   TRIG 154 (H) 05/30/2019    Wt Readings from Last 3 Encounters:  02/12/24 164 lb 6 oz (74.6 kg)  04/25/23 163 lb (73.9 kg)  03/21/23 168 lb 6.4 oz (76.4 kg)    ASSESSMENT AND PLAN:  Paroxysmal atrial flutter (HCC) -  Maintaining paced rhythm asymptomatic from his flutter On sotolol 120 daily, GFR <60 No palpitations,  not on anticoagulation per EP  Coronary artery disease with stable angina Off crestor , does not want  a pill Discussed Zetia, he prefers to recheck updated lipids before starting any medication  Mixed hyperlipidemia High numbers, in light of his high calcium  scoring Discussed options including statin, Zetia Currently prefers no pills at this time but will consider Zetia depending on updated lab work  Essential hypertension High end of the range Recommend he monitor at home  Elevated glucose Managed by primary care  Former smoker Likely with mild COPD  PVC's (premature ventricular contractions) Not an active issue, managed by Dr. Albertine Rowland   Orders Placed This Encounter  Procedures   EKG 12-Lead     Signed, Juanda Noon, M.D., Ph.D. 02/12/2024  [facility] [place], [place] [phone]

## 2024-02-12 ENCOUNTER — Ambulatory Visit: Attending: Cardiovascular Disease | Admitting: Cardiovascular Disease

## 2024-02-12 ENCOUNTER — Encounter: Payer: Self-pay | Admitting: Cardiovascular Disease

## 2024-02-12 VITALS — BP 140/80 | HR 63 | Ht 73.0 in | Wt 164.4 lb

## 2024-02-12 DIAGNOSIS — I441 Atrioventricular block, second degree: Secondary | ICD-10-CM

## 2024-02-12 DIAGNOSIS — Z950 Presence of cardiac pacemaker: Secondary | ICD-10-CM | POA: Diagnosis not present

## 2024-02-12 DIAGNOSIS — I10 Essential (primary) hypertension: Secondary | ICD-10-CM

## 2024-02-12 DIAGNOSIS — R001 Bradycardia, unspecified: Secondary | ICD-10-CM | POA: Diagnosis not present

## 2024-02-12 DIAGNOSIS — I4892 Unspecified atrial flutter: Secondary | ICD-10-CM

## 2024-02-12 DIAGNOSIS — I25118 Atherosclerotic heart disease of native coronary artery with other forms of angina pectoris: Secondary | ICD-10-CM

## 2024-02-12 DIAGNOSIS — E7800 Pure hypercholesterolemia, unspecified: Secondary | ICD-10-CM

## 2024-02-12 DIAGNOSIS — I495 Sick sinus syndrome: Secondary | ICD-10-CM | POA: Diagnosis not present

## 2024-02-12 DIAGNOSIS — E782 Mixed hyperlipidemia: Secondary | ICD-10-CM

## 2024-02-12 DIAGNOSIS — R42 Dizziness and giddiness: Secondary | ICD-10-CM

## 2024-02-12 NOTE — Patient Instructions (Signed)
 Medication Instructions:  No changes  Goal total cholesterol <150, LDL <70 Call if you would like zetia  If you need a refill on your cardiac medications before your next appointment, please call your pharmacy.   Lab work: No new labs needed  Testing/Procedures: No new testing needed  Follow-Up: At [facility], you and your health needs are our priority.  As part of our continuing mission to provide you with exceptional heart care, we have created designated Provider Care Teams.  These Care Teams include your primary Cardiologist (physician) and Advanced Practice Providers (APPs -  Physician Assistants and Nurse Practitioners) who all work together to provide you with the care you need, when you need it.  You will need a follow up appointment in 12 months  Providers on your designated Care Team:   Laneta Pintos, NP Varney Gentleman, PA-C Cadence Gennaro Khat, [place]  COVID-19 Vaccine Information can be found at: [email or web address] For questions related to vaccine distribution or appointments, please email [email or web address] or call [phone].

## 2024-02-21 ENCOUNTER — Ambulatory Visit (INDEPENDENT_AMBULATORY_CARE_PROVIDER_SITE_OTHER): Payer: Medicare Other

## 2024-02-21 DIAGNOSIS — I495 Sick sinus syndrome: Secondary | ICD-10-CM

## 2024-02-22 LAB — CUP PACEART REMOTE DEVICE CHECK
Battery Remaining Longevity: 75 mo
Battery Voltage: 2.97 V
Brady Statistic AP VP Percent: 97.16 %
Brady Statistic AP VS Percent: 0 %
Brady Statistic AS VP Percent: 2.76 %
Brady Statistic AS VS Percent: 0.07 %
Brady Statistic RA Percent Paced: 97.19 %
Brady Statistic RV Percent Paced: 99.92 %
Date Time Interrogation Session: 20250527212338
Implantable Lead Connection Status: 753985
Implantable Lead Connection Status: 753985
Implantable Lead Implant Date: 20200601
Implantable Lead Implant Date: 20200601
Implantable Lead Location: 753859
Implantable Lead Location: 753860
Implantable Lead Model: 5076
Implantable Lead Model: 5076
Implantable Pulse Generator Implant Date: 20200601
Lead Channel Impedance Value: 285 Ohm
Lead Channel Impedance Value: 342 Ohm
Lead Channel Impedance Value: 380 Ohm
Lead Channel Impedance Value: 399 Ohm
Lead Channel Pacing Threshold Amplitude: 0.625 V
Lead Channel Pacing Threshold Amplitude: 0.75 V
Lead Channel Pacing Threshold Pulse Width: 0.4 ms
Lead Channel Pacing Threshold Pulse Width: 0.4 ms
Lead Channel Sensing Intrinsic Amplitude: 1.25 mV
Lead Channel Sensing Intrinsic Amplitude: 1.25 mV
Lead Channel Sensing Intrinsic Amplitude: 6.125 mV
Lead Channel Sensing Intrinsic Amplitude: 6.125 mV
Lead Channel Setting Pacing Amplitude: 1.5 V
Lead Channel Setting Pacing Amplitude: 2 V
Lead Channel Setting Pacing Pulse Width: 0.4 ms
Lead Channel Setting Sensing Sensitivity: 2 mV
Zone Setting Status: 755011
Zone Setting Status: 755011

## 2024-02-25 ENCOUNTER — Ambulatory Visit: Payer: Self-pay | Admitting: Cardiology

## 2024-03-07 ENCOUNTER — Encounter: Admitting: Internal Medicine

## 2024-03-18 NOTE — Progress Notes (Unsigned)
 Electrophysiology Clinic Note    Date:  03/19/2024  Patient ID:  Kerry Rowland, Kerry Rowland 06-Dec-1935, MRN [id number] PCP:  [name], MD  Cardiologist:  [name], MD Electrophysiologist: [name], MD   Discussed the use of AI scribe software for clinical note transcription with the patient, who gave verbal consent to proceed.   Patient Profile    Chief Complaint: routine device follow-up  History of Present Illness: Kerry Rowland is a 88 y.o. male with PMH notable for tachy-brady s/p PPM, atrial flutter, afib, CAD, CKD - 3a, ; seen today for [name], MD for routine electrophysiology followup.   He last saw Dr. Fernande 02/2023 for routine follow-up and was doing well on 120mg  sotalol  daily, on ASA only for stroke ppx.  He last saw Dr. Gollan 01/2024.   On follow-up today, he feels very well.  He exercises regularly playing table tennis 2-3 times per week.  He denies palpitations, chest pain, chest pressure, shortness of breath or dizziness. He does note that he occasionally feels tired and needs to rest during activities. He has recently started on vit B supplement less than 1 week ago.   He checks blood pressure regularly, systolics 100-110s     Arrhythmia/Device History MDT dual chamber PPM, imp 02/2019; dx 2nd deg mobitz   AAD -  Sotalol       ROS:  Please see the history of present illness. All other systems are reviewed and otherwise negative.    Physical Exam    VS:  BP 130/82 (BP Location: Left Arm, Patient Position: Sitting, Cuff Size: Normal)   Pulse 75   Ht 6' 1 (1.854 m)   Wt 162 lb 6 oz (73.7 kg)   SpO2 98%   BMI 21.42 kg/m  BMI: Body mass index is 21.42 kg/m.      Wt Readings from Last 3 Encounters:  03/19/24 162 lb 6 oz (73.7 kg)  02/12/24 164 lb 6 oz (74.6 kg)  04/25/23 163 lb (73.9 kg)     GEN- The patient is well appearing, alert and oriented x 3 today.   Lungs- Clear to ausculation bilaterally, normal work of  breathing.  Heart- Regular rate and rhythm, no murmurs, rubs or gallops Extremities- No peripheral edema, warm, dry Skin-  device pocket well-healed, no tethering   Device interrogation done today and reviewed by myself:  Battery 6.1 years Lead thresholds, impedence, sensing stable  Dependent down to VVI 40 No episodes HR histograms blunted Adjusted RR ADL rate from 85 > 90 No further changes made today   Studies Reviewed   Previous EP, cardiology notes.    EKG is not ordered. Personal review of EKG from 02/12/2024 shows:  AP-VP at 63 QRS 206 QT 520, QTC 532; QTC corrected for wide QRS =        TTE, 02/22/2019  1. The left ventricle has low normal systolic function, with an ejection fraction of 50-55%. The cavity size was normal. There is mildly increased left ventricular wall thickness. Left ventricular diastolic Doppler parameters are consistent with pseudonormalization.   2. The right ventricle has normal systolic function. The cavity was normal. There is no increase in right ventricular wall thickness. Right ventricular systolic pressure is normal with an estimated pressure of 16.4 mmHg.   3. Left atrial size was mildly dilated.   4. Right atrial size was mildly dilated.   5. There is mild mitral annular calcification present. Mitral valve regurgitation is  mild to moderate by color flow Doppler.   6. The aortic valve is tricuspid. Aortic valve regurgitation is trivial by color flow Doppler.   7. 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   The  patient does not have concerns regarding his medicines.  The following changes were made today:  none  Labs/ tests ordered today include:  Orders Placed This Encounter  Procedures   ECHOCARDIOGRAM COMPLETE     Disposition: Follow up with Dr. Kennyth or EP APP in 12 months, sooner if TTE is concerning   Signed, Elyzabeth Goatley, NP  03/19/24  4:13 PM  Electrophysiology [facility]

## 2024-03-19 ENCOUNTER — Encounter: Payer: Self-pay | Admitting: Cardiology

## 2024-03-19 ENCOUNTER — Ambulatory Visit: Attending: Cardiology | Admitting: Cardiology

## 2024-03-19 VITALS — BP 130/82 | HR 75 | Ht 73.0 in | Wt 162.4 lb

## 2024-03-19 DIAGNOSIS — Z79899 Other long term (current) drug therapy: Secondary | ICD-10-CM

## 2024-03-19 DIAGNOSIS — I441 Atrioventricular block, second degree: Secondary | ICD-10-CM | POA: Diagnosis not present

## 2024-03-19 DIAGNOSIS — Z5181 Encounter for therapeutic drug level monitoring: Secondary | ICD-10-CM

## 2024-03-19 DIAGNOSIS — I4892 Unspecified atrial flutter: Secondary | ICD-10-CM | POA: Diagnosis not present

## 2024-03-19 DIAGNOSIS — Z950 Presence of cardiac pacemaker: Secondary | ICD-10-CM

## 2024-03-19 LAB — CUP PACEART INCLINIC DEVICE CHECK
Date Time Interrogation Session: 20250624162138
Implantable Lead Connection Status: 753985
Implantable Lead Connection Status: 753985
Implantable Lead Implant Date: 20200601
Implantable Lead Implant Date: 20200601
Implantable Lead Location: 753859
Implantable Lead Location: 753860
Implantable Lead Model: 5076
Implantable Lead Model: 5076
Implantable Pulse Generator Implant Date: 20200601

## 2024-03-19 NOTE — Patient Instructions (Signed)
 Medication Instructions:  No changes at this time.   *If you need a refill on your cardiac medications before your next appointment, please call your pharmacy*  Lab Work: None  If you have labs (blood work) drawn today and your tests are completely normal, you will receive your results only by: MyChart Message (if you have MyChart) OR A paper copy in the mail If you have any lab test that is abnormal or we need to change your treatment, we will call you to review the results.  Testing/Procedures: Your physician has requested that you have an echocardiogram. Echocardiography is a painless test that uses sound waves to create images of your heart. It provides your doctor with information about the size and shape of your heart and how well your heart's chambers and valves are working. This procedure takes approximately one hour. There are no restrictions for this procedure. Please do NOT wear cologne, perfume, aftershave, or lotions (deodorant is allowed). Please arrive 15 minutes prior to your appointment time.  Please note: We ask at that you not bring children with you during ultrasound (echo/ vascular) testing. Due to room size and safety concerns, children are not allowed in the ultrasound rooms during exams. Our front office staff cannot provide observation of children in our lobby area while testing is being conducted. An adult accompanying a patient to their appointment will only be allowed in the ultrasound room at the discretion of the ultrasound technician under special circumstances. We apologize for any inconvenience.   Follow-Up: At [facility], you and your health needs are our priority.  As part of our continuing mission to provide you with exceptional heart care, our providers are all part of one team.  This team includes your primary Cardiologist (physician) and Advanced Practice Providers or APPs (Physician Assistants and Nurse Practitioners) who all work together to  provide you with the care you need, when you need it.  Your next appointment:   1 year(s)  Provider:   Fonda Kitty, MD

## 2024-03-24 ENCOUNTER — Ambulatory Visit: Payer: Self-pay | Admitting: Cardiology

## 2024-04-02 ENCOUNTER — Ambulatory Visit: Attending: Cardiology

## 2024-04-02 DIAGNOSIS — Z950 Presence of cardiac pacemaker: Secondary | ICD-10-CM | POA: Diagnosis not present

## 2024-04-02 DIAGNOSIS — I441 Atrioventricular block, second degree: Secondary | ICD-10-CM

## 2024-04-02 LAB — ECHOCARDIOGRAM COMPLETE
AR max vel: 3.88 cm2
AV Area VTI: 3.41 cm2
AV Area mean vel: 3.49 cm2
AV Mean grad: 2 mmHg
AV Peak grad: 3 mmHg
Ao pk vel: 0.87 m/s
Area-P 1/2: 2.66 cm2
S' Lateral: 4.51 cm

## 2024-04-05 ENCOUNTER — Ambulatory Visit: Admitting: Cardiology

## 2024-04-09 ENCOUNTER — Ambulatory Visit: Attending: Cardiology | Admitting: Cardiology

## 2024-04-09 ENCOUNTER — Encounter: Payer: Self-pay | Admitting: Cardiology

## 2024-04-09 VITALS — BP 130/74 | HR 61 | Ht 73.0 in | Wt 164.4 lb

## 2024-04-09 DIAGNOSIS — Z79899 Other long term (current) drug therapy: Secondary | ICD-10-CM

## 2024-04-09 DIAGNOSIS — Z950 Presence of cardiac pacemaker: Secondary | ICD-10-CM | POA: Diagnosis not present

## 2024-04-09 DIAGNOSIS — E782 Mixed hyperlipidemia: Secondary | ICD-10-CM

## 2024-04-09 DIAGNOSIS — I4892 Unspecified atrial flutter: Secondary | ICD-10-CM

## 2024-04-09 DIAGNOSIS — I10 Essential (primary) hypertension: Secondary | ICD-10-CM

## 2024-04-09 DIAGNOSIS — I429 Cardiomyopathy, unspecified: Secondary | ICD-10-CM | POA: Diagnosis not present

## 2024-04-09 DIAGNOSIS — I25118 Atherosclerotic heart disease of native coronary artery with other forms of angina pectoris: Secondary | ICD-10-CM

## 2024-04-09 DIAGNOSIS — I5020 Unspecified systolic (congestive) heart failure: Secondary | ICD-10-CM

## 2024-04-09 MED ORDER — LOSARTAN POTASSIUM 25 MG PO TABS: 12.5000 mg | ORAL_TABLET | Freq: Every day | ORAL | 3 refills | Status: DC

## 2024-04-09 NOTE — Progress Notes (Signed)
 Cardiology Office Note   Date:  04/09/2024  ID:  Kerry Rowland, DOB Mar 28, 1936, MRN [id number] PCP: [name], MD  [facility] Providers Cardiologist:  [name], MD Electrophysiologist:  [name], MD     History of Present Illness Kerry Rowland is a 88 y.o. 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   Coronary calcium  score completed 04/15/2019 752 which was to 62nd percentile for age and sex matched controls.  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   He was seen in clinic 02/21/2024 by Dr. Gollan.  At that time he was doing well.  Had stopped taking rosuvastatin  due to side effects that he had read about.  On aspirin daily and had bruising and started taking his aspirin every other day.  This is not that he had been compliant with his medication regimen with any due to side effects.  He was continued on sotalol  120 mg daily discussed adding ezetimibe to his medication regimen and he preferred to have updated lipid panel prior to initiating any new medications.  No other changes were made and no further testing was ordered.   He was last seen in clinic by EP on 03/19/2024.  He states that he felt well and continued to exercise regularly playing table tennis 2-3 times per week.  Denied palpitations, chest pain, chest pressure or shortness of breath.  He occasionally feels tired and needs to rest during  activities.  He was recently started on vitamin D supplements less than a week ago.  Blood pressures have remained stable.  Device interrogation was done with no changes made to his device.  He was scheduled for an updated echocardiogram and continued on his current medication regimen.  He returns to clinic today stating that he has been doing well from a cardiac perspective.  He denies any shortness of breath, chest pain, peripheral edema.  Occasionally has some drops in his blood pressure at home as he does monitor it frequently.  History noted occasional lightheadedness and dizziness.  Remains active playing table tennis daily and has been walking with his wife about 30 minutes to an hour a day.  States that he has been compliant with his current medication regimen without any undue side effects.  Denies any hospitalizations or visits to the emergency department.  ROS: 10 point review of system has been reviewed and considered negative except ones were listed in the HPI  Studies Reviewed EKG Interpretation Date/Time:  Tuesday April 09 2024 10:12:15 EDT Ventricular Rate:  61 PR Interval:  212 QRS Duration:  162 QT Interval:  462 QTC Calculation: 465 R Axis:   -59  Text Interpretation: AV dual-paced rhythm with prolonged AV conduction When compared with ECG of 12-Feb-2024 08:08, No significant change since last tracing Confirmed by Gerard Frederick ([id number]) on 04/09/2024 10:15:39 AM    2D echo 04/02/2024 1. Left ventricular ejection fraction, by estimation, is 35 to 40%. The  left ventricle has moderately decreased function. The left ventricle  demonstrates global hypokinesis. Left ventricular diastolic parameters are  consistent  with Grade I diastolic  dysfunction (impaired relaxation). The average left ventricular global  longitudinal strain is -10.4 %. The global longitudinal strain is  abnormal.   2. Right ventricular systolic function is normal. The right ventricular  size is normal. There  is normal pulmonary artery systolic pressure. The  estimated right ventricular systolic pressure is 24.7 mmHg.   3. The mitral valve is normal in structure. Mild mitral valve  regurgitation. No evidence of mitral stenosis.   4. The aortic valve is tricuspid. Aortic valve regurgitation is mild. No  aortic stenosis is present.   5. There is mild dilatation of the aortic root, measuring 42 mm.   6. The inferior vena cava is normal in size with greater than 50%  respiratory variability, suggesting right atrial pressure of 3 mmHg.   TTE, 02/22/2019  1. The left ventricle has low normal systolic function, with an ejection fraction of 50-55%. The cavity size was normal. There is mildly increased left ventricular wall thickness. Left ventricular diastolic Doppler parameters are consistent with pseudonormalization.   2. The right ventricle has normal systolic function. The cavity was normal. There is no increase in right ventricular wall thickness. Right ventricular systolic pressure is normal with an estimated pressure of 16.4 mmHg.   3. Left atrial size was mildly dilated.   4. Right atrial size was mildly dilated.   5. There is mild mitral annular calcification present. Mitral valve regurgitation is mild to moderate by color flow Doppler.   6. The aortic valve is tricuspid. Aortic valve regurgitation is trivial by color flow Doppler.   7. 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  Risk Assessment/Calculations              Physical Exam VS:  BP 130/74   Pulse 61   Ht 6' 1 (1.854 m)   Wt 164 lb 6.4 oz (74.6 kg)   SpO2 100%   BMI 21.69 kg/m        Wt Readings from Last 3 Encounters:  04/09/24 164 lb 6.4 oz (74.6 kg)  03/19/24 162 lb 6 oz (73.7 kg)  02/12/24 164 lb 6 oz (74.6 kg)    GEN: Well nourished, well developed in no acute distress NECK: No JVD; No carotid bruits CARDIAC: RRR, no murmurs, rubs, gallops RESPIRATORY:  Clear to auscultation without rales,  wheezing or rhonchi  ABDOMEN: Soft, non-tender, non-distended EXTREMITIES:  No edema; No deformity   ASSESSMENT AND PLAN Cardiomyopathy with recent echocardiogram completed revealed an LVEF that was 35-40% with moderately decreased function, G1 DD, RVSF normal and normal in size, mild mitral valve regurgitation, mild aortic regurgitation, mild dilatation of the aortic root measuring 42 mm.  Discussed GDMT and various medications.  With history of hypotension and orthostasis we will avoid Arni and SGLT2 inhibitor at this time, also discussed MRA's versus ARB.  Will start low-dose losartan  12.5 mg daily today, follow-up BMP in 2 weeks. Recommend continued follow-up with EP for management of sotalol  and recommendations for beta-blocker therapy.  After patient is placed on GDMT and has been tolerated repeat limited echocardiogram in 3 months.  Paroxysmal atrial flutter rate is maintaining AV paced rhythm on EKG.  He is continued on sotalol  120 mg daily.  Not currently on OAC.  Continues to take aspirin 81 mg every other day.  No episodes on last pacemaker interrogation of atrial flutter or fibrillation were noted.  Coronary artery disease with stable angina.  He denies any anginal or  anginal equivalents.  He is continue aspirin 81 mg every other day.  He previously declined statin or ezetimibe.  LDL remains above goal at 865.  EKG with no ischemic changes.  No further ischemic workup at this time.  Hypertension with history of orthostatic hypotension blood pressure today 130/74.  Patient states her blood pressure ranges from 90-115 systolic at home.  He has been started on low-dose losartan  for blood pressure today 130 systolic.  He has been encouraged to continue to monitor his pressure 1 to 2 hours postmedication administration at home.  Hyperlipidemia with last LDL of 134.  Unfortunately patient continues to decline statin therapy or ezetimibe.  Will revisit on return appointment.  CKD stage IIIa with  last serum creatinine of 1.2.  GFR 59.  After being started on losartan  today he is having a repeat BMP completed next 2 weeks.  He has been encouraged to maintain adequate hydration and avoid NSAIDs.  Cardiac device in situ he was last evaluated by EP on 03/15/2024 where his device was functioning well, slightly adjusted RRR, hide VP from RV apical lead.  Ongoing management per EP       Dispo: Patient to return to clinic to see MD/APP in 4 to 6 weeks or sooner if needed for further evaluation after initiating GDMT  Signed, Devri Kreher, NP

## 2024-04-09 NOTE — Patient Instructions (Signed)
 Medication Instructions:  Your physician recommends the following medication changes.  START TAKING: Losartan  12.5 mg daily  *If you need a refill on your cardiac medications before your next appointment, please call your pharmacy*  Lab Work: Your provider would like for you to return in 2 weeks to have the following labs drawn: BMP.   Please go to [facility] [address] ([facility]) [address], [place] [address] You do not need an appointment.  They are open from 8 am- 4:30 pm.  Lunch from 1:00 pm- 2:00 pm You do not need to be fasting.   You may also go to one of the following [facility]:  [address] [place], [place] [address] Phone: [phone] Lab hours: Mon-Fri 8 am- 5 pm    Lunch 12 pm- 1 pm  [address] [place],  [place]  [address]  [place]  Phone: [phone] Lab hours: 7 am- 4 pm Lunch 12 pm-1 pm   [address] [place],  [place]  [address]  [place]  Phone: [phone] Lab hours: Mon-Fri 8 am- 5 pm    Lunch 12 pm- 1 pm  If you have labs (blood work) drawn today and your tests are completely normal, you will receive your results only by: MyChart Message (if you have MyChart) OR A paper copy in the mail If you have any lab test that is abnormal or we need to change your treatment, we will call you to review the results.  Testing/Procedures:      MID SEPTEMBER .Your physician has requested that you have an echocardiogram. Echocardiography is a painless test that uses sound waves to create images of your heart. It provides your doctor with information about the size and shape of your heart and how well your heart's chambers and valves are working.   You may receive an ultrasound enhancing agent through an IV if needed to better visualize your heart during the echo. This procedure takes approximately one hour.  There are no restrictions for this procedure.  This will take place at [address] [facility]) [address], [place]  [address]  Please note: We ask at that you not bring children with you during ultrasound (echo/ vascular) testing. Due to room size and safety concerns, children are not allowed in the ultrasound rooms during exams. Our front office staff cannot provide observation of children in our lobby area while testing is being conducted. An adult accompanying a patient to their appointment will only be allowed in the ultrasound room at the discretion of the ultrasound technician under special circumstances. We apologize for any inconvenience.   Follow-Up: At [facility], you and your health needs are our priority.  As part of our continuing mission to provide you with exceptional heart care, our providers are all part of one team.  This team includes your primary Cardiologist (physician) and Advanced Practice Providers or APPs (Physician Assistants and Nurse Practitioners) who all work together to provide you with the care you need, when you need it.  Your next appointment:   3 - 4 week(s) 4 - 6 weeks with EP  Provider:   Timothy Gollan, MD or Tylene Lunch, NP

## 2024-04-12 ENCOUNTER — Other Ambulatory Visit: Payer: Self-pay

## 2024-04-12 DIAGNOSIS — I5020 Unspecified systolic (congestive) heart failure: Secondary | ICD-10-CM

## 2024-04-15 NOTE — Addendum Note (Signed)
 Addended by: VICCI SELLER A on: 04/15/2024 01:46 PM   Modules accepted: Orders

## 2024-04-15 NOTE — Progress Notes (Signed)
 Remote pacemaker transmission.

## 2024-04-23 ENCOUNTER — Other Ambulatory Visit: Payer: Self-pay

## 2024-04-23 DIAGNOSIS — Z79899 Other long term (current) drug therapy: Secondary | ICD-10-CM

## 2024-04-24 ENCOUNTER — Ambulatory Visit: Payer: Self-pay | Admitting: Cardiology

## 2024-04-24 LAB — BASIC METABOLIC PANEL WITH GFR
BUN/Creatinine Ratio: 22 (ref 10–24)
BUN: 28 mg/dL — ABNORMAL HIGH (ref 8–27)
CO2: 20 mmol/L (ref 20–29)
Calcium: 9.5 mg/dL (ref 8.6–10.2)
Chloride: 104 mmol/L (ref 96–106)
Creatinine, Ser: 1.27 mg/dL (ref 0.76–1.27)
Glucose: 76 mg/dL (ref 70–99)
Potassium: 4.5 mmol/L (ref 3.5–5.2)
Sodium: 141 mmol/L (ref 134–144)
eGFR: 55 mL/min/1.73 — ABNORMAL LOW (ref >59)

## 2024-04-24 NOTE — Progress Notes (Signed)
 Kidney function and potassium still remain stable.  Recommend maintaining adequate hydration and continue current medication regimen without changes at this time.

## 2024-04-29 ENCOUNTER — Ambulatory Visit: Admitting: Urology

## 2024-04-30 ENCOUNTER — Ambulatory Visit: Payer: Self-pay | Admitting: Urology

## 2024-05-21 ENCOUNTER — Other Ambulatory Visit

## 2024-05-22 ENCOUNTER — Ambulatory Visit: Attending: Cardiology | Admitting: Cardiology

## 2024-05-22 ENCOUNTER — Ambulatory Visit (INDEPENDENT_AMBULATORY_CARE_PROVIDER_SITE_OTHER)

## 2024-05-22 ENCOUNTER — Encounter: Payer: Self-pay | Admitting: Cardiology

## 2024-05-22 VITALS — BP 124/62 | HR 72 | Ht 73.0 in | Wt 164.4 lb

## 2024-05-22 DIAGNOSIS — I25118 Atherosclerotic heart disease of native coronary artery with other forms of angina pectoris: Secondary | ICD-10-CM | POA: Diagnosis not present

## 2024-05-22 DIAGNOSIS — Z950 Presence of cardiac pacemaker: Secondary | ICD-10-CM

## 2024-05-22 DIAGNOSIS — I495 Sick sinus syndrome: Secondary | ICD-10-CM | POA: Diagnosis not present

## 2024-05-22 DIAGNOSIS — I5020 Unspecified systolic (congestive) heart failure: Secondary | ICD-10-CM

## 2024-05-22 DIAGNOSIS — I429 Cardiomyopathy, unspecified: Secondary | ICD-10-CM | POA: Diagnosis not present

## 2024-05-22 DIAGNOSIS — R42 Dizziness and giddiness: Secondary | ICD-10-CM

## 2024-05-22 DIAGNOSIS — I4892 Unspecified atrial flutter: Secondary | ICD-10-CM

## 2024-05-22 DIAGNOSIS — I10 Essential (primary) hypertension: Secondary | ICD-10-CM

## 2024-05-22 DIAGNOSIS — N1831 Chronic kidney disease, stage 3a: Secondary | ICD-10-CM

## 2024-05-22 DIAGNOSIS — E782 Mixed hyperlipidemia: Secondary | ICD-10-CM

## 2024-05-22 NOTE — Patient Instructions (Signed)
 Medication Instructions:  Your physician recommends that you continue on your current medications as directed. Please refer to the Current Medication list given to you today.   *If you need a refill on your cardiac medications before your next appointment, please call your pharmacy*  Lab Work: None ordered at this time  If you have labs (blood work) drawn today and your tests are completely normal, you will receive your results only by: MyChart Message (if you have MyChart) OR A paper copy in the mail If you have any lab test that is abnormal or we need to change your treatment, we will call you to review the results.  Testing/Procedures: None ordered at this time   Follow-Up: At [facility] you and your health needs are our priority.  As part of our continuing mission to provide you with exceptional heart care, our providers are all part of one team.  This team includes your primary Cardiologist (physician) and Advanced Practice Providers or APPs (Physician Assistants and Nurse Practitioners) who all work together to provide you with the care you need, when you need it.  Your next appointment:   3 month(s)  Provider:   Timothy Gollan, MD or Tylene Lunch, NP    We recommend signing up for the patient portal called MyChart.  Sign up information is provided on this After Visit Summary.  MyChart is used to connect with patients for Virtual Visits (Telemedicine).  Patients are able to view lab/test results, encounter notes, upcoming appointments, etc.  Non-urgent messages can be sent to your provider as well.   To learn more about what you can do with MyChart, go to [email or web address].

## 2024-05-22 NOTE — Progress Notes (Signed)
 Cardiology Office Note   Date:  05/22/2024  ID:  Kerry, Rowland [birth year], MRN [id number] PCP: [name], MD  [facility] Providers Cardiologist:  [name], MD Electrophysiologist:  [name], MD     History of Present Illness Kerry Rowland is a 88 y.o. male with past medical history of hypoglycemia, hyperlipidemia, primary pretension, coronary artery disease, orthostatic lightheadedness, history of atrial flutter, osteoarthritis, gout, former smoker, permanent pacemaker placement (02/2019) for tachybradycardia syndrome/complete heart block, coronary calcium  score 752 (03/2019), CKD stage IIIa, palpitations, who presents today for follow-up.   Coronary calcium  score completed 04/15/2019 752 which was to 62nd percentile for age and sex matched controls.  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   He was seen in clinic 02/21/2024 by Dr. Gollan.  At that time he was doing well.  Had stopped taking rosuvastatin  due to side effects that he had read about.  On aspirin daily and had bruising and started taking his aspirin every other day.  This is not that he had been compliant with his medication regimen with any due to side effects.  He was continued on sotalol  120 mg daily discussed adding ezetimibe to his medication regimen and he preferred to have updated lipid panel prior to initiating any new medications.  No other changes were made and no further testing was ordered.  He was evaluated in clinic by EP on 03/15/2024 stating that he had felt well and continue to exercise regularly.  At blood pressure remained stable.  Device interrogation was done with no changes.  He was scheduled for an up dated echocardiogram and  continued on his current medication regimen.   He was last seen in clinic 04/09/2024 stating he been doing well from a cardiac perspective.  GDMT was started with low-dose losartan  12.5 mg daily with follow-up BMP in 2 weeks.  He was recommended to keep his follow-up appointments with EP for the management of sotalol .  No further testing was ordered.  He returns to clinic today stating that overall he has been doing well from a cardiac perspective.  He denies any shortness of breath, chest pain, or peripheral edema.  He did have 1 drop that was noted in his blood pressure on July 30 where he noted that he had generalized weakness and fatigue.  He continues to remain active with playing table tennis.  He states that he has tolerated low-dose of losartan .  States he has been compliant with his current medication regimen without any undue side effects.  Denies any hospitalizations or visits to the emergency department.  ROS: 10 point review of systems has been reviewed and considered negative except ones been listed in the HPI  Studies Reviewed EKG Interpretation Date/Time:  Wednesday May 22 2024 09:18:38 EDT Ventricular Rate:  72 PR Interval:    QRS Duration:  172 QT Interval:  454 QTC Calculation: 497 R Axis:   -62  Text Interpretation: AV dual-paced rhythm When compared with ECG of 09-Apr-2024 10:12, No significant change since last tracing Confirmed by Gerard Frederick ([id number]) on 05/22/2024 9:24:45 AM    2D echo 04/02/2024 1. Left ventricular ejection fraction, by estimation, is 35 to 40%. The  left ventricle has moderately decreased function. The left ventricle  demonstrates global hypokinesis.  Left ventricular diastolic parameters are  consistent with Grade I diastolic  dysfunction (impaired relaxation). The average left ventricular global  longitudinal strain is -10.4 %. The global longitudinal strain is  abnormal.   2. Right ventricular systolic function is normal. The right ventricular   size is normal. There is normal pulmonary artery systolic pressure. The  estimated right ventricular systolic pressure is 24.7 mmHg.   3. The mitral valve is normal in structure. Mild mitral valve  regurgitation. No evidence of mitral stenosis.   4. The aortic valve is tricuspid. Aortic valve regurgitation is mild. No  aortic stenosis is present.   5. There is mild dilatation of the aortic root, measuring 42 mm.   6. The inferior vena cava is normal in size with greater than 50%  respiratory variability, suggesting right atrial pressure of 3 mmHg.    TTE, 02/22/2019  1. The left ventricle has low normal systolic function, with an ejection fraction of 50-55%. The cavity size was normal. There is mildly increased left ventricular wall thickness. Left ventricular diastolic Doppler parameters are consistent with pseudonormalization.   2. The right ventricle has normal systolic function. The cavity was normal. There is no increase in right ventricular wall thickness. Right ventricular systolic pressure is normal with an estimated pressure of 16.4 mmHg.   3. Left atrial size was mildly dilated.   4. Right atrial size was mildly dilated.   5. There is mild mitral annular calcification present. Mitral valve regurgitation is mild to moderate by color flow Doppler.   6. The aortic valve is tricuspid. Aortic valve regurgitation is trivial by color flow Doppler.   7. 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  Risk Assessment/Calculations              Physical Exam VS:  BP 124/62 (BP Location: Left Arm, Patient Position: Sitting, Cuff Size: Normal)   Pulse 72   Ht 6' 1 (1.854 m)   Wt 164 lb 6.4 oz (74.6 kg)   SpO2 99%   BMI 21.69 kg/m        Wt Readings from Last 3 Encounters:  05/22/24 164 lb 6.4 oz (74.6 kg)  04/09/24 164 lb 6.4 oz (74.6 kg)  03/19/24 162 lb 6 oz (73.7 kg)    GEN: Well nourished, well developed in no acute distress NECK: No JVD; No carotid  bruits CARDIAC: RRR, no murmurs, rubs, gallops RESPIRATORY:  Clear to auscultation without rales, wheezing or rhonchi  ABDOMEN: Soft, non-tender, non-distended EXTREMITIES:  No edema; No deformity   ASSESSMENT AND PLAN Cardiomyopathy with recent echocardiogram with LVEF of 35-40% with moderately decreased function, G1 DD, RV SF normal on size and function, mild mitral valve regurgitation mild aortic regurgitation, and mild dilatation of the aortic root measuring 42 mm.  Recently started on a low-dose of losartan  12.5 mg daily with a BMP in 2 weeks which revealed stable kidney function.  With continued history of drops of blood pressure with hypotension and orthostasis we have avoided Arni and SGLT2 inhibitors and MRA therapy.  He has been scheduled for repeat limited echocardiogram in 3 months.  But given continued drops in blood pressure escalation of GDMT will be difficult.  He continues to have drops and we are limited with up titration escalation of GDMT after limited echocardiogram is completed will refer to advanced heart failure clinic.  Paroxysmal atrial flutter rate continues to maintain AV paced rhythm at a rate of 72 with no ischemic changes  noted.  He is continued on sotalol  120 mg daily.  He is currently not on OAC.  Continues on aspirin 81 mg every other day.  No episodes of atrial flutter or fibrillation were noted on last device interrogation.  Coronary artery disease with stable angina.  He denies any angina or anginal equivalents.  He is continued on aspirin 81 mg every other day but continues to decline the statin therapy.  EKG with no ischemic changes.  No further ischemic workup at this time.  If LVEF continues to remain low on limited echo will discuss stress testing.  Hypertension with history of orthostatic hypotension with a blood pressure today 140/85 with a repeat blood pressure of 124/62.  Blood pressure is labile.  He has been continued on his current medication regimen  today.  Has been encouraged to continue to monitor pressures 1 to 2 hours postmedication administration as well.  Hyperlipidemia with last LDL of 134.  Goal of LDL is less than 70 ideally 55.  Continues to decline statin therapy.   CKD stage IIIa with last serum creatinine with his last serum creatinine of 1.27 with a GFR 55.  Continue to remain adequately hydrated and avoid NSAID therapy.  Cardiac device in situ where he has device upload today and in person appointment in several weeks.  Ongoing management per EP.       Dispo: Patient to return to clinic to see MD/APP after limited echocardiogram was completed or sooner if needed for further evaluation.  Signed, Sybella Harnish, NP

## 2024-05-23 LAB — CUP PACEART REMOTE DEVICE CHECK
Battery Remaining Longevity: 69 mo
Battery Voltage: 2.97 V
Brady Statistic AP VP Percent: 96.89 %
Brady Statistic AP VS Percent: 0.01 %
Brady Statistic AS VP Percent: 3.05 %
Brady Statistic AS VS Percent: 0.06 %
Brady Statistic RA Percent Paced: 96.9 %
Brady Statistic RV Percent Paced: 99.93 %
Date Time Interrogation Session: 20250826213147
Implantable Lead Connection Status: 753985
Implantable Lead Connection Status: 753985
Implantable Lead Implant Date: 20200601
Implantable Lead Implant Date: 20200601
Implantable Lead Location: 753859
Implantable Lead Location: 753860
Implantable Lead Model: 5076
Implantable Lead Model: 5076
Implantable Pulse Generator Implant Date: 20200601
Lead Channel Impedance Value: 304 Ohm
Lead Channel Impedance Value: 380 Ohm
Lead Channel Impedance Value: 380 Ohm
Lead Channel Impedance Value: 418 Ohm
Lead Channel Pacing Threshold Amplitude: 0.625 V
Lead Channel Pacing Threshold Amplitude: 0.875 V
Lead Channel Pacing Threshold Pulse Width: 0.4 ms
Lead Channel Pacing Threshold Pulse Width: 0.4 ms
Lead Channel Sensing Intrinsic Amplitude: 2.125 mV
Lead Channel Sensing Intrinsic Amplitude: 2.125 mV
Lead Channel Sensing Intrinsic Amplitude: 6.5 mV
Lead Channel Sensing Intrinsic Amplitude: 6.5 mV
Lead Channel Setting Pacing Amplitude: 1.75 V
Lead Channel Setting Pacing Amplitude: 2 V
Lead Channel Setting Pacing Pulse Width: 0.4 ms
Lead Channel Setting Sensing Sensitivity: 2 mV
Zone Setting Status: 755011
Zone Setting Status: 755011

## 2024-05-26 ENCOUNTER — Ambulatory Visit: Payer: Self-pay | Admitting: Cardiology

## 2024-05-28 NOTE — Progress Notes (Unsigned)
  Electrophysiology Office Follow up Visit Note:    Date:  05/29/2024   ID:  Kerry Rowland, DOB [birth year], MRN [id number]  PCP:  [name], MD  [facility] Cardiologist:  [name], MD  [facility] Electrophysiologist:  [name], MD    Interval History:     Kerry Rowland is a 88 y.o. male who presents for a follow up visit.   The patient saw Elvie in March 19, 2024.  He has a permanent pacemaker implanted for tachycardia-bradycardia syndrome, atrial flutter, atrial fibrillation, coronary artery disease.  Was previously followed by Dr. Fernande.  He takes 120 mg of sotalol  daily.  He has had good control of his arrhythmias with his sotalol .  He is doing well. No presyncope/syncope. No HF symptoms.       Past medical, surgical, social and family history were reviewed.  ROS:   Please see the history of present illness.    All other systems reviewed and are negative.  EKGs/Labs/Other Studies Reviewed:    The following studies were reviewed today:  May 28, 2024 echo EF 35 to 40% RV normal Mild MR Mild AI  May 29, 2024 in-clinic device interrogation personally reviewed Battery and lead parameters stable.    May 22, 2024 EKG shows AV paced rhythm, paced QRS 172 ms, left bundle branch pattern      Physical Exam:    VS:  BP 132/82 (BP Location: Left Arm, Patient Position: Sitting, Cuff Size: Normal)   Pulse 72   Ht 6' 1 (1.854 m)   Wt 164 lb 4 oz (74.5 kg)   SpO2 98%   BMI 21.67 kg/m     Wt Readings from Last 3 Encounters:  05/29/24 164 lb 4 oz (74.5 kg)  05/22/24 164 lb 6.4 oz (74.6 kg)  04/09/24 164 lb 6.4 oz (74.6 kg)     GEN: no distress.  Elderly CARD: RRR, No MRG.  Pacemaker pocket well-healed RESP: No IWOB. CTAB.      ASSESSMENT:    1. Cardiac pacemaker in situ   2. Paroxysmal atrial flutter (HCC)    PLAN:    In order of problems listed above:  #Chronic systolic heart failure #High risk med  monitoring-sotalol  #Paroxysmal atrial fibrillation flutter Changes in sotalol  to amiodarone  Stop sotalol  today and let wash out for 72 hours. Then start amiodarone  200mg  PO BID x 14 days followed by 200mg  PO daily. Risks of amiodarone  and need for close monitoring were discussed with the patient and he is agreeable to the change.  Check CMP, TSH and FT4 today and again in about 6-8 weeks.  He will need a repeat echo in about 3-4 months.   Consideration for CRT upgrade if EF persistently reduced Not on anticoagulation for stroke prophylaxis, aspirin alone.  The patient wants to proceed with aspirin alone.  #Permanent pacemaker in situ Pacemaker functioning appropriately.  Consideration of CRT upgrade after GDMT optimization as above. Continue remote monitoring  Follow-up 6 months with APP.   Signed, OLE HOLTS, MD, [facility], [facility] 05/29/2024 8:39 AM    Electrophysiology [facility]

## 2024-05-29 ENCOUNTER — Ambulatory Visit: Attending: Cardiology | Admitting: Cardiology

## 2024-05-29 ENCOUNTER — Encounter: Payer: Self-pay | Admitting: Cardiology

## 2024-05-29 ENCOUNTER — Other Ambulatory Visit: Payer: Self-pay

## 2024-05-29 VITALS — BP 132/82 | HR 72 | Ht 73.0 in | Wt 164.2 lb

## 2024-05-29 DIAGNOSIS — Z950 Presence of cardiac pacemaker: Secondary | ICD-10-CM | POA: Diagnosis not present

## 2024-05-29 DIAGNOSIS — I4892 Unspecified atrial flutter: Secondary | ICD-10-CM

## 2024-05-29 LAB — CUP PACEART INCLINIC DEVICE CHECK
Date Time Interrogation Session: 20250903085306
Implantable Lead Connection Status: 753985
Implantable Lead Connection Status: 753985
Implantable Lead Implant Date: 20200601
Implantable Lead Implant Date: 20200601
Implantable Lead Location: 753859
Implantable Lead Location: 753860
Implantable Lead Model: 5076
Implantable Lead Model: 5076
Implantable Pulse Generator Implant Date: 20200601
Lead Channel Impedance Value: 437 Ohm
Lead Channel Impedance Value: 456 Ohm
Lead Channel Pacing Threshold Amplitude: 0.75 V
Lead Channel Pacing Threshold Amplitude: 0.75 V
Lead Channel Pacing Threshold Pulse Width: 0.4 ms
Lead Channel Pacing Threshold Pulse Width: 0.4 ms
Lead Channel Sensing Intrinsic Amplitude: 1.9 mV
Lead Channel Sensing Intrinsic Amplitude: 2.1 mV

## 2024-05-29 MED ORDER — AMIODARONE HCL 200 MG PO TABS: ORAL_TABLET | ORAL | 3 refills | Status: AC

## 2024-05-29 NOTE — Patient Instructions (Signed)
 Medication Instructions:  Your physician has recommended you make the following change in your medication:  1) STOP taking sotalolol  2) START taking amiodarone  (72 hours after stopping sotalol )   - take 1 tablet (200 mg total) TWICE a day for 2 weeks, then  - take 1 tablet (200 mg total) ONCE a day  *If you need a refill on your cardiac medications before your next appointment, please call your pharmacy*  Lab Work: TODAY: CMET, TSH, T4 IN 8 WEEKS: CMET, TSH, T4  Follow-Up: At [facility], you and your health needs are our priority.  As part of our continuing mission to provide you with exceptional heart care, our providers are all part of one team.  This team includes your primary Cardiologist (physician) and Advanced Practice Providers or APPs (Physician Assistants and Nurse Practitioners) who all work together to provide you with the care you need, when you need it.  Your next appointment:   6 months  Provider:   Suzann Riddle, NP

## 2024-05-30 LAB — COMPREHENSIVE METABOLIC PANEL WITH GFR
ALT: 12 IU/L (ref 0–44)
AST: 19 IU/L (ref 0–40)
Albumin: 4.5 g/dL (ref 3.7–4.7)
Alkaline Phosphatase: 69 IU/L (ref 44–121)
BUN/Creatinine Ratio: 33 — ABNORMAL HIGH (ref 10–24)
BUN: 39 mg/dL — ABNORMAL HIGH (ref 8–27)
Bilirubin Total: 0.6 mg/dL (ref 0.0–1.2)
CO2: 22 mmol/L (ref 20–29)
Calcium: 10 mg/dL (ref 8.6–10.2)
Chloride: 104 mmol/L (ref 96–106)
Creatinine, Ser: 1.17 mg/dL (ref 0.76–1.27)
Globulin, Total: 2.6 g/dL (ref 1.5–4.5)
Glucose: 79 mg/dL (ref 70–99)
Potassium: 4.7 mmol/L (ref 3.5–5.2)
Sodium: 140 mmol/L (ref 134–144)
Total Protein: 7.1 g/dL (ref 6.0–8.5)
eGFR: 61 mL/min/1.73 (ref >59)

## 2024-05-30 LAB — TSH: TSH: 1.55 u[IU]/mL (ref 0.450–4.500)

## 2024-05-30 LAB — T4, FREE: Free T4: 1.46 ng/dL (ref 0.82–1.77)

## 2024-06-01 ENCOUNTER — Ambulatory Visit: Payer: Self-pay | Admitting: Cardiology

## 2024-06-02 NOTE — Progress Notes (Unsigned)
 06/03/24 11:38 AM   Kerry Rowland September 14, 1936 [id number]  Referring provider:  [name], MD [address] [facility] [place],  [place] [address]  Urological history  1. BPH with LU TS -tadalafil  5 mg every other day   2. ED -contributing factors of age, testosterone deficiency, HLD, HTN and former smoker -failed tadalafil  5 mg daily    3. Testosterone deficiency -testosterone therapy did not provide benefit  HPI: Kerry Rowland is a 88 y.o.male who presents today for a one year follow-up.   Previous records reviewed.    He reports no sensation of incomplete bladder emptying, urinary frequency, urinary intermittency, no urinary urgency, a weak urinary stream, not having to strain to void, nocturia x 1, and no leaking before being able to reach the restroom.  Patient denies any modifying or aggravating factors.  Patient denies any recent UTI's, gross hematuria, dysuria or suprapubic/flank pain.  Patient denies any fevers, chills, nausea or vomiting.    UA (02/2024) negative  Serum creatinine (05/2024) 1.5, eGFR 45  Hemoglobin A1c (907974) 5.7  He stopped taking the dutasteride  several months ago and has not seen any worsening of his symptoms and he does not wish to restart it.  He is also taking the tadalafil  5 mg every other day and this seems to address his LUTS satisfactorily.  He does not have confidence that he could get and keep an erection, his erections are not firm enough for penetrative intercourse, he has difficulty maintaining his erections,  and he is not finding intercourse satisfactory for him.    Patient is not having spontaneous erections.  He denies any pain or curvature with erections.    He is able to ejaculate.  Cholesterol (05/2024) 210  TSH (02/2024) 2.060   Tried and failed tadalafil  5 mg daily  PMH: Past Medical History:  Diagnosis Date   Acute idiopathic gout of right foot    Colon polyps    Constipation     Erectile dysfunction    Generalized abdominal mass    Hyperglycemia    Osteoarthritis    Paroxysmal atrial flutter (HCC)    Raynaud's phenomenon    Skin cancer     Surgical History: Past Surgical History:  Procedure Laterality Date   APPENDECTOMY     CATARACT EXTRACTION     COLONOSCOPY WITH PROPOFOL  N/A 05/22/2015   Procedure: COLONOSCOPY WITH PROPOFOL ;  Surgeon: Lamar ONEIDA Holmes, MD;  Location: [facility] ENDOSCOPY;  Service: Endoscopy;  Laterality: N/A;   PACEMAKER IMPLANT N/A 02/25/2019   Procedure: PACEMAKER IMPLANT;  Surgeon: Fernande Elspeth BROCKS, MD;  Location: [facility] INVASIVE CV LAB;  Service: Cardiovascular;  Laterality: N/A;   RETINAL DETACHMENT SURGERY     2023   skin grafts     TONSILLECTOMY      Home Medications:  Allergies as of 06/03/2024       Reactions   Cashew Nut Oil Hives   Macadamia Nut Oil Hives   Peanut (diagnostic)    Rash    Pecan Nut (diagnostic) Hives   Pistachio Nut (diagnostic) Hives   Morphine And Codeine Other (See Comments)   Chills, felt cold    Nsaids Other (See Comments)   intestinal bleeding   Other    Nightshade plants - inflammation pain   Phenergan [promethazine Hcl]    Uncontrolled muscle movement         Medication List        Accurate as of  June 03, 2024 11:38 AM. If you have any questions, ask your nurse or doctor.          STOP taking these medications    b complex vitamins tablet   Multi-Minerals Tabs   ZINC PO       TAKE these medications    Alpha-Lipoic Acid 300 MG Caps Take by mouth.   amiodarone  200 MG tablet Commonly known as: PACERONE  Take 1 tablet (200 mg total) by mouth 2 (two) times daily for 14 days, THEN 1 tablet (200 mg total) daily. Start taking on: June 01, 2024   aspirin EC 81 MG tablet Take 81 mg by mouth every other day.   BLACK PEPPER-TURMERIC PO Take 1 Dose by mouth daily.   cetirizine 10 MG tablet Commonly known as: ZYRTEC Take 10 mg by mouth daily as needed for allergies.    dutasteride  0.5 MG capsule Commonly known as: AVODART  TAKE ONE CAPSULE BY MOUTH ONE TIME DAILY   ipratropium 0.06 % nasal spray Commonly known as: ATROVENT Place 2 sprays into the nose as needed.   losartan  25 MG tablet Commonly known as: Cozaar  Take 0.5 tablets (12.5 mg total) by mouth daily.   MAGNESIUM PO Take 1 tablet by mouth daily.   sotalol  120 MG tablet Commonly known as: BETAPACE  Take 120 mg by mouth 2 (two) times daily.   tadalafil  5 MG tablet Commonly known as: CIALIS  Take 1 tablet (5 mg total) by mouth daily as needed for erectile dysfunction.   triamcinolone cream 0.1 % Commonly known as: KENALOG APPLY TO THE AFFECTED AREA ON ARMS TWICE DAILY UNTIL CLEAR   VITAMIN C PO Take 1 tablet by mouth daily.   VITAMIN D PO Take 1 tablet by mouth daily.   VITAMIN E PO Take 1 capsule by mouth daily.        Allergies:  Allergies  Allergen Reactions   Cashew Nut Oil Hives   Macadamia Nut Oil Hives   Peanut (Diagnostic)     Rash     Pecan Nut (Diagnostic) Hives   Pistachio Nut (Diagnostic) Hives   Morphine And Codeine Other (See Comments)    Chills, felt cold    Nsaids Other (See Comments)    intestinal bleeding   Other     Nightshade plants - inflammation pain   Phenergan [Promethazine Hcl]     Uncontrolled muscle movement     Family History: Family History  Problem Relation Age of Onset   Kidney disease Neg Hx    Prostate cancer Neg Hx    Kidney cancer Neg Hx    Bladder Cancer Neg Hx     Social History:  reports that he has quit smoking. His smoking use included cigarettes. He has never used smokeless tobacco. He reports current alcohol use. He reports that he does not use drugs.   Physical Exam: BP 139/76 (BP Location: Left Arm, Patient Position: Sitting, Cuff Size: Normal)   Pulse 77   Ht 6' 1 (1.854 m)   Wt 163 lb 6.4 oz (74.1 kg)   SpO2 99%   BMI 21.56 kg/m   Constitutional:  Well nourished. Alert and oriented, No acute  distress. HEENT: [place] AT, moist mucus membranes.  Trachea midline Cardiovascular: No clubbing, cyanosis, or edema. Respiratory: Normal respiratory effort, no increased work of breathing. Neurologic: Grossly intact, no focal deficits, moving all 4 extremities. Psychiatric: Normal mood and affect.   Laboratory Data: Contains abnormal data Comprehensive Metabolic Panel (CMP) Order: [id number] Component Ref Range &  Units 2 d ago  Glucose 70 - 110 mg/dL 893  Sodium 863 - 854 mmol/L 140  Potassium 3.6 - 5.1 mmol/L 4.4  Chloride 97 - 109 mmol/L 107  Carbon Dioxide (CO2) 22.0 - 32.0 mmol/L 27.3  Urea Nitrogen (BUN) 7 - 25 mg/dL 44 High   Creatinine 0.7 - 1.3 mg/dL 1.5 High   Glomerular Filtration Rate (eGFR) >60 mL/min/1.73sq m 45 Low   Comment: CKD-EPI (2021) does not include patient's race in the calculation of eGFR.  Monitoring changes of plasma creatinine and eGFR over time is useful for monitoring kidney function.  Interpretive Ranges for eGFR (CKD-EPI 2021):  eGFR:       >60 mL/min/1.73 sq. m - Normal eGFR:       30-59 mL/min/1.73 sq. m - Moderately Decreased eGFR:       15-29 mL/min/1.73 sq. m  - Severely Decreased eGFR:       < 15 mL/min/1.73 sq. m  - Kidney Failure   Note: These eGFR calculations do not apply in acute situations when eGFR is changing rapidly or patients on dialysis.  Calcium  8.7 - 10.3 mg/dL 9.6  AST 8 - 39 U/L 16  ALT 6 - 57 U/L 11  Alk Phos (alkaline Phosphatase) 34 - 104 U/L 59  Albumin 3.5 - 4.8 g/dL 4.5  Bilirubin, Total 0.3 - 1.2 mg/dL 0.8  Protein, Total 6.1 - 7.9 g/dL 7.1  A/G Ratio 1.0 - 5.0 gm/dL 1.7  Resulting Agency [facility]   Specimen Collected: 05/31/24 08:54   Performed by: [facility] Last Resulted: 05/31/24 16:23  Received From: [facility]  Result Received: 06/01/24 15:19   Hemoglobin A1C Order: [id number] Component Ref Range & Units 2 d ago  Hemoglobin A1C 4.2 - 5.6 %  5.7 High   Average Blood Glucose (Calc) mg/dL 882  Resulting Agency [facility]  Narrative Performed by [facility] Normal Range:    4.2 - 5.6% Increased Risk:  5.7 - 6.4% Diabetes:        >= 6.5% Glycemic Control for adults with diabetes:  <7%    Specimen Collected: 05/31/24 08:54   Performed by: [facility] Last Resulted: 05/31/24 10:41  Received From: [facility]  Result Received: 06/01/24 15:19   Urinalysis w/Microscopic Order: [id number] Component Ref Range & Units 3 mo ago  Color Colorless, Straw, Light Yellow, Yellow, Dark Yellow Yellow  Clarity Clear Clear  Specific Gravity 1.005 - 1.030 1.020  pH, Urine 5.0 - 8.0 5.5  Protein, Urinalysis Negative mg/dL Negative  Glucose, Urinalysis Negative mg/dL Negative  Ketones, Urinalysis Negative mg/dL Negative  Blood, Urinalysis Negative Negative  Nitrite, Urinalysis Negative Negative  Leukocyte Esterase, Urinalysis Negative Negative  Bilirubin, Urinalysis Negative Negative  Urobilinogen, Urinalysis 0.2 - 1.0 mg/dL 0.2  WBC, UA <=5 /hpf 0  Red Blood Cells, Urinalysis <=3 /hpf 1  Bacteria, Urinalysis 0 - 5 /hpf 0-5  Squamous Epithelial Cells, Urinalysis /hpf 0  Resulting Agency [facility]   Specimen Collected: 02/26/24 08:53   Performed by: [facility] Last Resulted: 02/26/24 12:54  Received From: [facility]  Result Received: 03/11/24 14:33  I have reviewed the labs. See HPI  Assessment & Plan:    BPH with LUTS  -at goal with meds  -Continue tadalafil   5 mg every other day - Discontinue dutasteride   2. ED - Did  not find taking the tadalafil  5 mg daily effective now he is taking tadalafil  5 mg every other day as he wants to stop taking a lot of medications - Encouraged him to try the tadalafil  20 mg on demand dosing, advised him to take it 30 minutes prior to intercourse and assess his  response.  I explained that in some men it may take several hours after taking the medication to achieve the erection, so I would make note of this when he takes the medication, I also stated he can take up to 40 mg of tadalafil  if he does not feel he gets a response with the 20 mg - He is not taking nitrates  Return in about 1 year (around 06/03/2025) for Medication management.   CLOTILDA HELON RIGGERS   [facility] [address] [place], [place] [address] [phone]

## 2024-06-03 ENCOUNTER — Encounter: Payer: Self-pay | Admitting: Urology

## 2024-06-03 ENCOUNTER — Ambulatory Visit: Admitting: Urology

## 2024-06-03 VITALS — BP 139/76 | HR 77 | Ht 73.0 in | Wt 163.4 lb

## 2024-06-03 DIAGNOSIS — N138 Other obstructive and reflux uropathy: Secondary | ICD-10-CM | POA: Diagnosis not present

## 2024-06-03 DIAGNOSIS — N529 Male erectile dysfunction, unspecified: Secondary | ICD-10-CM | POA: Diagnosis not present

## 2024-06-03 DIAGNOSIS — N401 Enlarged prostate with lower urinary tract symptoms: Secondary | ICD-10-CM | POA: Diagnosis not present

## 2024-06-10 NOTE — Progress Notes (Signed)
 Remote PPM Transmission

## 2024-06-12 ENCOUNTER — Ambulatory Visit: Attending: Cardiology

## 2024-06-12 ENCOUNTER — Other Ambulatory Visit

## 2024-06-12 DIAGNOSIS — I5020 Unspecified systolic (congestive) heart failure: Secondary | ICD-10-CM | POA: Diagnosis not present

## 2024-06-12 LAB — ECHOCARDIOGRAM LIMITED
AV Mean grad: 2 mmHg
AV Peak grad: 4.2 mmHg
Ao pk vel: 1.02 m/s
Area-P 1/2: 3.27 cm2
S' Lateral: 3.87 cm

## 2024-06-13 ENCOUNTER — Ambulatory Visit: Payer: Self-pay | Admitting: Cardiology

## 2024-06-25 NOTE — Telephone Encounter (Signed)
 There potentially could be drug to drug interaction between the tadalafil  and sotalol .  With a longstanding history of hypotension and a recent episode that you just experienced and discussed with your primary care provider it is not advisable to take higher doses of the medication for the erectile dysfunction as it does allow for a vasodilation effect which can exacerbate the hypotension. I would recommend working with your urologist to find additional options for ED.

## 2024-07-10 ENCOUNTER — Encounter: Payer: Self-pay | Admitting: Cardiology

## 2024-07-10 ENCOUNTER — Ambulatory Visit: Attending: Cardiology | Admitting: Cardiology

## 2024-07-10 VITALS — BP 130/74 | HR 68 | Ht 73.0 in | Wt 166.6 lb

## 2024-07-10 DIAGNOSIS — E782 Mixed hyperlipidemia: Secondary | ICD-10-CM

## 2024-07-10 DIAGNOSIS — I429 Cardiomyopathy, unspecified: Secondary | ICD-10-CM | POA: Diagnosis not present

## 2024-07-10 DIAGNOSIS — Z950 Presence of cardiac pacemaker: Secondary | ICD-10-CM

## 2024-07-10 DIAGNOSIS — I4892 Unspecified atrial flutter: Secondary | ICD-10-CM

## 2024-07-10 DIAGNOSIS — R42 Dizziness and giddiness: Secondary | ICD-10-CM

## 2024-07-10 DIAGNOSIS — N1831 Chronic kidney disease, stage 3a: Secondary | ICD-10-CM

## 2024-07-10 DIAGNOSIS — I25118 Atherosclerotic heart disease of native coronary artery with other forms of angina pectoris: Secondary | ICD-10-CM | POA: Diagnosis not present

## 2024-07-10 DIAGNOSIS — I10 Essential (primary) hypertension: Secondary | ICD-10-CM

## 2024-07-10 MED ORDER — LOSARTAN POTASSIUM 25 MG PO TABS: 12.5000 mg | ORAL_TABLET | Freq: Every day | ORAL | 3 refills | Status: AC

## 2024-07-10 NOTE — Progress Notes (Signed)
 Cardiology Office Note   Date:  07/10/2024  ID:  Kerry Rowland, DOB 17-Nov-1935, MRN [id number] PCP: Kerry Ophelia [name], MD   HeartCare Providers Cardiologist:  [name], MD Electrophysiologist:  [name], MD     History of Present Illness Kerry Rowland is a 88 y.o. male with a past medical history of hyperglycemia, hyperlipidemia, primary hypertension, coronary artery disease, orthostatic lightheadedness, history of atrial flutter, osteoporosis, gout, former smoker, permanent pacemaker placement (02/2019) for tachybradycardia syndrome/complete heart block, coronary calcium  score of 752 (03/2019), CKD stage IIIa, palpitations, who presents today to follow-up.   Coronary calcium  score completed 04/15/2019 752 which was to 62nd percentile for age and sex matched controls.  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   He was seen in clinic 02/21/2024 by Dr. Gollan.  At that time he was doing well.  Had stopped taking rosuvastatin  due to side effects that he had read about.  On aspirin daily and had bruising and started taking his aspirin every other day.  This is not that he had been compliant with his medication regimen with any due to side effects.  He was continued on sotalol  120 mg daily discussed adding ezetimibe to his medication regimen and he preferred to have updated lipid panel prior to initiating any new medications.  No other changes were made and no further testing was ordered.  He was evaluated in clinic by EP on 03/15/2024 stating that he had felt well and continue to exercise regularly.  At blood pressure remained stable.  Device interrogation was done with no changes.  He was scheduled for an up dated echocardiogram and  continued on his current medication regimen.  He was seen in clinic 04/09/2024 with cardiac perspective.  GDMT was started with low-dose losartan  12.5 mg daily with follow-up BMP in 2 weeks.  He was recommended to keep follow-up appointments with PCP for management of sotalol .  No further testing was performed at that time.  He was seen in clinic 05/22/2024 and was doing well from a cardiac perspective.  Denied any shortness of breath, chest pain or peripheral edema.  He stated he had 1 drop in blood pressure was noted on July 1934 he had generalized weakness and fatigue.  Continue to remain active playing table tennis.  Recommended for repeat limited echocardiogram in 3 months.  With his orthostasis escalation of GDMT was difficult.  After his limited echocardiogram was completed and he continued to be limited on escalation of GDMT he had no improvement in LVEF.  Recommendation was referral to advanced heart failure clinic.   He was last seen in clinic 06/15/2024 by EP Dr. HOLTS.  He been doing well with no presyncope or syncopal episodes.  He was changed from sotalol  to amiodarone .  Sotalol  was to washout for 72 hours then start amiodarone  200 mg twice daily for 14 days followed by 200 mg daily.  He is also scheduled for CMP, TSH, and free T4 today and again in 6 to 8 weeks.  He would also need a repeat echocardiogram in 3 to 4 months.  Consideration for CRT upgrade if EF was persistently reduced.  He continued to decline OAC and remains on aspirin alone.  Recommendation was to follow-up with EP APP in 6 months.  He returns to clinic today stating that overall he has been doing well from a cardiac perspective he has only noticed 1 dip in his blood pressure to the 90s over 60s.  He continues to remain active playing table tennis.  States that he has not had any aggressive bouts of palpitations since being changed from sotalol  to amiodarone .  They are getting ready to leave town today to [place]  for the  winter they are gone for approximately 6 months out of the year.  He has been tolerating the losartan  well but there was a slight bump in his kidney function with no additional initiation of ARB therapy.  He states he has been compliant with his current medication regimen.  Denies any hospitalizations or visits to the emergency department.  ROS: 10 point review of system has been reviewed and considered negative the exception was been listed in the HPI  Studies Reviewed EKG Interpretation Date/Time:  Wednesday July 10 2024 09:19:03 EDT Ventricular Rate:  68 PR Interval:    QRS Duration:  174 QT Interval:  454 QTC Calculation: 482 R Axis:   -60  Text Interpretation: AV dual-paced rhythm When compared with ECG of 22-May-2024 09:18, No significant change since last tracing Confirmed by Gerard Frederick ([id number]) on 07/10/2024 9:26:51 AM    2D echo 04/02/2024 1. Left ventricular ejection fraction, by estimation, is 35 to 40%. The  left ventricle has moderately decreased function. The left ventricle  demonstrates global hypokinesis. Left ventricular diastolic parameters are  consistent with Grade I diastolic  dysfunction (impaired relaxation). The average left ventricular global  longitudinal strain is -10.4 %. The global longitudinal strain is  abnormal.   2. Right ventricular systolic function is normal. The right ventricular  size is normal. There is normal pulmonary artery systolic pressure. The  estimated right ventricular systolic pressure is 24.7 mmHg.   3. The mitral valve is normal in structure. Mild mitral valve  regurgitation. No evidence of mitral stenosis.   4. The aortic valve is tricuspid. Aortic valve regurgitation is mild. No  aortic stenosis is present.   5. There is mild dilatation of the aortic root, measuring 42 mm.   6. The inferior vena cava is normal in size with greater than 50%  respiratory variability, suggesting right atrial pressure of 3 mmHg.    TTE,  02/22/2019  1. The left ventricle has low normal systolic function, with an ejection fraction of 50-55%. The cavity size was normal. There is mildly increased left ventricular wall thickness. Left ventricular diastolic Doppler parameters are consistent with pseudonormalization.   2. The right ventricle has normal systolic function. The cavity was normal. There is no increase in right ventricular wall thickness. Right ventricular systolic pressure is normal with an estimated pressure of 16.4 mmHg.   3. Left atrial size was mildly dilated.   4. Right atrial size was mildly dilated.   5. There is mild mitral annular calcification present. Mitral valve regurgitation is mild to moderate by color flow Doppler.   6. The aortic valve is tricuspid. Aortic valve regurgitation is trivial by color flow Doppler.   7. 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   Risk Assessment/Calculations         Physical Exam VS:  BP 130/74   Pulse 68   Ht 6' 1 (1.854 m)   Wt 166 lb 9.6 oz (75.6 kg)   SpO2 98%   BMI 21.98 kg/m  Wt Readings from Last 3 Encounters:  07/10/24 166 lb 9.6 oz (75.6 kg)  06/03/24 163 lb 6.4 oz (74.1 kg)  05/29/24 164 lb 4 oz (74.5 kg)    GEN: Well nourished, well developed in no acute distress NECK: No JVD; No carotid bruits CARDIAC: RRR, no murmurs, rubs, gallops RESPIRATORY:  Clear to auscultation without rales, wheezing or rhonchi  ABDOMEN: Soft, non-tender, non-distended EXTREMITIES:  No edema; No deformity   ASSESSMENT AND PLAN Cardiomyopathy with recent echocardiogram with an LVEF of 35 to 40% with mildly decreased function, G1 DD, RV SF normal size and function, mild mitral valve regurgitation and mild aortic regurgitation mild dilatation measuring 42 mm.  Continued on losartan  12.5 mg daily.  With continued history of drops of blood pressure with hypotension and orthostatic dizziness have avoided ARNI and SGLT2 inhibitors and MRA therapy.  With  initiating ARB therapy there was a slight bump in his serum creatinine that has improved.  Will repeat limited echocardiogram and try to escalate GDMT as tolerated.  Paroxysmal atrial flutter with EKG today revealing AV paced with rate of 68 with no ischemic changes.  He is continued on amiodarone  200 mg daily with surveillance labs scheduled in the next couple weeks prior to him leaving for [place]  CMP TSH free T4.  And he continues to remain on aspirin 81 mg every other day not on OAC as he has had less than 0.1% of atrial fibrillation atrial flutter on his device interrogations.  Coronary artery disease with stable angina he denies any anginal or anginal equivalents.  Continued on aspirin 81 mg every other day continues to decline statin therapy.  No ischemic changes noted on EKG.  No further ischemic workup at this time.  Primary hypertension with a blood pressure today 130/74.  He has been continued on losartan  12.5 mg daily.  He has been encouraged to continue to monitor his pressure 1 to 2 hours postmedication administration as well.  Mixed hyperlipidemia with last LDL of 134 with a goal of less than 70 ideally 55.  He continues to decline statin therapy.  CKD stage IIIa with last creatinine of 1.3.  He continues to be encouraged to maintain adequate hydration and avoid NSAIDs.  Cardiac device in situ was last device interrogation revealed less than 0 point percent of atrial fibrillation atrial flutter.  Ongoing management per EP.       Dispo: Patient return to clinic to see MD/APP when he returns from [place]  approximately 6 months or sooner if needed  Signed, Rohini Jaroszewski, NP

## 2024-07-10 NOTE — Patient Instructions (Signed)
 Medication Instructions:  Your physician recommends the following medication changes.  STOP TAKING: Betapace   START TAKING: Amiodarone   *If you need a refill on your cardiac medications before your next appointment, please call your pharmacy*  Lab Work: Please come to office for lab collection If you have labs (blood work) drawn today and your tests are completely normal, you will receive your results only by: MyChart Message (if you have MyChart) OR A paper copy in the mail If you have any lab test that is abnormal or we need to change your treatment, we will call you to review the results.  Testing/Procedures: No test ordered today   Follow-Up: At [facility], you and your health needs are our priority.  As part of our continuing mission to provide you with exceptional heart care, our providers are all part of one team.  This team includes your primary Cardiologist (physician) and Advanced Practice Providers or APPs (Physician Assistants and Nurse Practitioners) who all work together to provide you with the care you need, when you need it.  Your next appointment:   6 month(s)  Provider:   You may see Timothy Gollan, MD or one of the following Advanced Practice Providers on your designated Care Team:   Lonni Meager, NP Lesley Maffucci, PA-C Bernardino Bring, PA-C Cadence Kalapana, PA-C Tylene Lunch, NP Barnie Hila, NP

## 2024-07-17 LAB — COMPREHENSIVE METABOLIC PANEL WITH GFR
ALT: 14 IU/L (ref 0–44)
AST: 18 IU/L (ref 0–40)
Albumin: 4.8 g/dL — ABNORMAL HIGH (ref 3.7–4.7)
Alkaline Phosphatase: 66 IU/L (ref 48–129)
BUN/Creatinine Ratio: 19 (ref 10–24)
BUN: 28 mg/dL — ABNORMAL HIGH (ref 8–27)
Bilirubin Total: 0.6 mg/dL (ref 0.0–1.2)
CO2: 25 mmol/L (ref 20–29)
Calcium: 9.9 mg/dL (ref 8.6–10.2)
Chloride: 103 mmol/L (ref 96–106)
Creatinine, Ser: 1.44 mg/dL — ABNORMAL HIGH (ref 0.76–1.27)
Globulin, Total: 2.6 g/dL (ref 1.5–4.5)
Glucose: 114 mg/dL — ABNORMAL HIGH (ref 70–99)
Potassium: 4.5 mmol/L (ref 3.5–5.2)
Sodium: 140 mmol/L (ref 134–144)
Total Protein: 7.4 g/dL (ref 6.0–8.5)
eGFR: 47 mL/min/1.73 — ABNORMAL LOW (ref >59)

## 2024-07-17 LAB — TSH: TSH: 2.84 u[IU]/mL (ref 0.450–4.500)

## 2024-07-17 LAB — T4, FREE: Free T4: 1.49 ng/dL (ref 0.82–1.77)

## 2024-08-21 ENCOUNTER — Ambulatory Visit

## 2024-08-21 DIAGNOSIS — I4892 Unspecified atrial flutter: Secondary | ICD-10-CM

## 2024-08-23 LAB — CUP PACEART REMOTE DEVICE CHECK
Battery Remaining Longevity: 66 mo
Battery Voltage: 2.96 V
Brady Statistic AP VP Percent: 78.36 %
Brady Statistic AP VS Percent: 0.08 %
Brady Statistic AS VP Percent: 21.24 %
Brady Statistic AS VS Percent: 0.33 %
Brady Statistic RA Percent Paced: 78.43 %
Brady Statistic RV Percent Paced: 99.59 %
Date Time Interrogation Session: 20251126042709
Implantable Lead Connection Status: 753985
Implantable Lead Connection Status: 753985
Implantable Lead Implant Date: 20200601
Implantable Lead Implant Date: 20200601
Implantable Lead Location: 753859
Implantable Lead Location: 753860
Implantable Lead Model: 5076
Implantable Lead Model: 5076
Implantable Pulse Generator Implant Date: 20200601
Lead Channel Impedance Value: 285 Ohm
Lead Channel Impedance Value: 361 Ohm
Lead Channel Impedance Value: 361 Ohm
Lead Channel Impedance Value: 418 Ohm
Lead Channel Pacing Threshold Amplitude: 0.625 V
Lead Channel Pacing Threshold Amplitude: 0.75 V
Lead Channel Pacing Threshold Pulse Width: 0.4 ms
Lead Channel Pacing Threshold Pulse Width: 0.4 ms
Lead Channel Sensing Intrinsic Amplitude: 1.25 mV
Lead Channel Sensing Intrinsic Amplitude: 1.25 mV
Lead Channel Sensing Intrinsic Amplitude: 5.5 mV
Lead Channel Sensing Intrinsic Amplitude: 5.5 mV
Lead Channel Setting Pacing Amplitude: 1.5 V
Lead Channel Setting Pacing Amplitude: 2 V
Lead Channel Setting Pacing Pulse Width: 0.4 ms
Lead Channel Setting Sensing Sensitivity: 2 mV
Zone Setting Status: 755011
Zone Setting Status: 755011

## 2024-08-26 ENCOUNTER — Ambulatory Visit: Payer: Self-pay | Admitting: Cardiology

## 2024-08-27 NOTE — Progress Notes (Signed)
 Remote PPM Transmission

## 2024-11-20 ENCOUNTER — Ambulatory Visit

## 2025-02-03 ENCOUNTER — Ambulatory Visit: Admitting: Cardiology

## 2025-02-19 ENCOUNTER — Ambulatory Visit

## 2025-05-21 ENCOUNTER — Ambulatory Visit

## 2025-06-09 ENCOUNTER — Ambulatory Visit: Admitting: Urology
